# Patient Record
Sex: Male | Born: 1985 | Race: Black or African American | Hispanic: No | Marital: Single | Smoking: Current every day smoker
Health system: Southern US, Community
[De-identification: ages and names within clinical notes are randomized; demographics above are authoritative.]

## PROBLEM LIST (undated history)

## (undated) HISTORY — PX: WISDOM TOOTH EXTRACTION: SHX21

---
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
## 2012-08-20 ENCOUNTER — Emergency Department (HOSPITAL_COMMUNITY)
Admission: EM | Admit: 2012-08-20 | Discharge: 2012-08-20 | Disposition: A | Discharge status: Home / Self Care | Payer: Self-pay | Attending: Emergency Medicine | Admitting: Emergency Medicine

## 2012-08-20 ENCOUNTER — Encounter (HOSPITAL_COMMUNITY): Payer: Self-pay | Admitting: *Deleted

## 2012-08-20 DIAGNOSIS — F17200 Nicotine dependence, unspecified, uncomplicated: Secondary | ICD-10-CM | POA: Insufficient documentation

## 2012-08-20 DIAGNOSIS — B353 Tinea pedis: Secondary | ICD-10-CM | POA: Insufficient documentation

## 2012-08-20 DIAGNOSIS — L03119 Cellulitis of unspecified part of limb: Secondary | ICD-10-CM | POA: Insufficient documentation

## 2012-08-20 DIAGNOSIS — L02619 Cutaneous abscess of unspecified foot: Secondary | ICD-10-CM | POA: Insufficient documentation

## 2012-08-20 DIAGNOSIS — L0390 Cellulitis, unspecified: Secondary | ICD-10-CM

## 2012-08-20 MED ORDER — SULFAMETHOXAZOLE-TRIMETHOPRIM 800-160 MG PO TABS: 1.0000 | ORAL_TABLET | Freq: Two times a day (BID) | ORAL | Status: DC

## 2012-08-20 MED ORDER — OXYCODONE-ACETAMINOPHEN 5-325 MG PO TABS: 1.0000 | ORAL_TABLET | Freq: Four times a day (QID) | ORAL | Status: DC | PRN

## 2012-08-20 MED ORDER — SULFAMETHOXAZOLE-TMP DS 800-160 MG PO TABS
1.0000 | ORAL_TABLET | Freq: Once | ORAL | Status: AC
  Administered 2012-08-20: 1 via ORAL
  Filled 2012-08-20: qty 1

## 2012-08-20 MED ORDER — OXYCODONE-ACETAMINOPHEN 5-325 MG PO TABS
2.0000 | ORAL_TABLET | Freq: Once | ORAL | Status: AC
  Administered 2012-08-20: 2 via ORAL
  Filled 2012-08-20: qty 2

## 2012-08-20 MED ORDER — CEPHALEXIN 250 MG PO CAPS
500.0000 mg | ORAL_CAPSULE | Freq: Once | ORAL | Status: AC
  Administered 2012-08-20: 500 mg via ORAL
  Filled 2012-08-20: qty 2

## 2012-08-20 MED ORDER — CEPHALEXIN 500 MG PO CAPS: 500.0000 mg | ORAL_CAPSULE | Freq: Four times a day (QID) | ORAL | Status: DC

## 2012-08-20 MED ORDER — CLOTRIMAZOLE 1 % EX CREA: TOPICAL_CREAM | CUTANEOUS | Status: DC

## 2012-08-20 NOTE — ED Provider Notes (Signed)
History     CSN: [id number]  Arrival date & time 08/20/12  2035   First MD Initiated Contact with Patient 08/20/12 2047      Chief Complaint  Patient presents with  . Foot Pain    (Consider location/radiation/quality/duration/timing/severity/associated sxs/prior treatment) HPI Comments: Patient presents with complaint of right toe pain X 2 days. Patient states the he began to notice pain, swelling, and redness today. Denies insult or trauma.    The history is provided by the patient. No language interpreter was used.    History reviewed. No pertinent past medical history.  History reviewed. No pertinent past surgical history.  History reviewed. No pertinent family history.  History  Substance Use Topics  . Smoking status: Current Every Day Smoker  . Smokeless tobacco: Not on file  . Alcohol Use: Yes     Comment: occassionally      Review of Systems  Constitutional: Negative for fever and chills.  Musculoskeletal: Positive for arthralgias.  Skin: Positive for color change.    Allergies  Review of patient's allergies indicates no known allergies.  Home Medications  No current outpatient prescriptions on file.  BP 146/89  Pulse 87  Temp 99 F (37.2 C) (Oral)  Resp 16  SpO2 97%  Physical Exam  Nursing note and vitals reviewed. Constitutional: He appears well-developed and well-nourished.  HENT:  Head: Normocephalic and atraumatic.  Mouth/Throat: Oropharynx is clear and moist.  Eyes: Conjunctivae normal and EOM are normal. No scleral icterus.  Neck: Normal range of motion. Neck supple.  Cardiovascular: Normal rate, regular rhythm and normal heart sounds.   Pulmonary/Chest: Effort normal and breath sounds normal.  Abdominal: Soft. Bowel sounds are normal. There is no tenderness.  Neurological: He is alert.  Skin: Skin is warm and dry.       ED Course  Procedures (including critical care time)  Labs Reviewed - No data to display No results  found.   1. Tinea pedis   2. Cellulitis       MDM  Patient presented with signs of secondary skin infection due to poor foot hygiene and tinea pedis. Given first dose on ABX in the ER. Line demarcated around area of cellulitis. Instructed to return in 2 days for recheck. Discharged on bactrim and keflex. Also given topical antifungal for tinea pedis. Return precautions given.         Pixie Casino, PA-C 08/20/12 2147

## 2012-08-20 NOTE — ED Notes (Signed)
Pt state right foot pain. Pain for 2 days along with swelling. Pt states hurts to ambulate and pt states uses foot to help with work by moving heavy boxes and top of foot always hurts.

## 2012-08-20 NOTE — ED Notes (Signed)
Pt stated that he has been having right foot pain since yesterday. It is his third toe. In between toes there is a wound with pus coming out of the wound. Foul smell coming from toes. Swelling in foot. CNS intact.

## 2012-08-21 NOTE — ED Provider Notes (Signed)
Medical screening examination/treatment/procedure(s) were performed by non-physician practitioner and as supervising physician I was immediately available for consultation/collaboration.   Jaquann Guarisco, MD 08/21/12 0028 

## 2014-03-10 ENCOUNTER — Encounter (HOSPITAL_COMMUNITY): Payer: Self-pay | Admitting: Emergency Medicine

## 2014-03-10 ENCOUNTER — Emergency Department (HOSPITAL_COMMUNITY)
Admission: EM | Admit: 2014-03-10 | Discharge: 2014-03-10 | Disposition: A | Discharge status: Home / Self Care | Payer: BC Managed Care – PPO | Attending: Emergency Medicine | Admitting: Emergency Medicine

## 2014-03-10 ENCOUNTER — Emergency Department (HOSPITAL_COMMUNITY): Payer: BC Managed Care – PPO

## 2014-03-10 DIAGNOSIS — Z79899 Other long term (current) drug therapy: Secondary | ICD-10-CM | POA: Insufficient documentation

## 2014-03-10 DIAGNOSIS — M545 Low back pain, unspecified: Secondary | ICD-10-CM

## 2014-03-10 DIAGNOSIS — S39012A Strain of muscle, fascia and tendon of lower back, initial encounter: Secondary | ICD-10-CM

## 2014-03-10 DIAGNOSIS — Y9389 Activity, other specified: Secondary | ICD-10-CM | POA: Insufficient documentation

## 2014-03-10 DIAGNOSIS — Y929 Unspecified place or not applicable: Secondary | ICD-10-CM | POA: Insufficient documentation

## 2014-03-10 DIAGNOSIS — S335XXA Sprain of ligaments of lumbar spine, initial encounter: Secondary | ICD-10-CM | POA: Insufficient documentation

## 2014-03-10 DIAGNOSIS — F17200 Nicotine dependence, unspecified, uncomplicated: Secondary | ICD-10-CM | POA: Insufficient documentation

## 2014-03-10 DIAGNOSIS — Z792 Long term (current) use of antibiotics: Secondary | ICD-10-CM | POA: Insufficient documentation

## 2014-03-10 DIAGNOSIS — X500XXA Overexertion from strenuous movement or load, initial encounter: Secondary | ICD-10-CM | POA: Insufficient documentation

## 2014-03-10 MED ORDER — NAPROXEN 500 MG PO TABS: 500.0000 mg | ORAL_TABLET | Freq: Two times a day (BID) | ORAL | Status: DC | PRN

## 2014-03-10 MED ORDER — MORPHINE SULFATE 4 MG/ML IJ SOLN
4.0000 mg | Freq: Once | INTRAMUSCULAR | Status: AC
  Administered 2014-03-10: 4 mg via INTRAMUSCULAR
  Filled 2014-03-10: qty 1

## 2014-03-10 MED ORDER — IBUPROFEN 800 MG PO TABS
800.0000 mg | ORAL_TABLET | Freq: Once | ORAL | Status: AC
  Administered 2014-03-10: 800 mg via ORAL
  Filled 2014-03-10: qty 1

## 2014-03-10 MED ORDER — OXYCODONE-ACETAMINOPHEN 5-325 MG PO TABS: 1.0000 | ORAL_TABLET | Freq: Four times a day (QID) | ORAL | Status: DC | PRN

## 2014-03-10 MED ORDER — METHOCARBAMOL 500 MG PO TABS: 500.0000 mg | ORAL_TABLET | Freq: Two times a day (BID) | ORAL | Status: DC

## 2014-03-10 MED ORDER — HYDROCODONE-ACETAMINOPHEN 5-325 MG PO TABS
2.0000 | ORAL_TABLET | Freq: Once | ORAL | Status: AC
  Administered 2014-03-10: 2 via ORAL
  Filled 2014-03-10: qty 2

## 2014-03-10 MED ORDER — METHOCARBAMOL 500 MG PO TABS
500.0000 mg | ORAL_TABLET | Freq: Once | ORAL | Status: AC
  Administered 2014-03-10: 500 mg via ORAL
  Filled 2014-03-10: qty 1

## 2014-03-10 MED ORDER — ONDANSETRON 8 MG PO TBDP
8.0000 mg | ORAL_TABLET | Freq: Once | ORAL | Status: AC
  Administered 2014-03-10: 8 mg via ORAL
  Filled 2014-03-10: qty 1

## 2014-03-10 NOTE — ED Provider Notes (Signed)
CSN: [id number]     Arrival date & time 03/10/14  1247 History  This chart was scribed for non-physician practitioner, Dierdre ForthHannah Muthersbaugh, PA-C,working with Raeford RazorStephen Kohut, MD, by Karle PlumberJennifer Tensley, ED Scribe.  This patient was seen in room WTR6/WTR6 and the patient's care was started at 1:01 PM.  Chief Complaint  Patient presents with  . Back Pain    nonradiating back pain since last night   The history is provided by the patient. No language interpreter was used.   HPI Comments:  Jonathan Stokes is a 28 y.o. male who presents to the Emergency Department complaining of severe worsening mid lower back pain that started secondary to lifting a heavy cast iron pizza pan out of the oven last night. He reports past back issues but has since changed jobs without issue. Pt states he has not taken anything for the pain but has tried to rest as much as possible. He denies any back surgeries, diagnosis of bulging discs or any other diagnosis. He denies saddle anesthesia, bowel or bladder incontinence or weakness, numbness, or tingling of the lower extremities. He denies h/o cancer, IV drug use, or taking any anticoagulants. He denies any allergies to any medication. Pt reports eating earlier today. Pt is ambulatory without issue.  History reviewed. No pertinent past medical history. History reviewed. No pertinent past surgical history. Family History  Problem Relation Age of Onset  . Diabetes Other   . Hypertension Other    History  Substance Use Topics  . Smoking status: Current Every Day Smoker    Types: Cigarettes  . Smokeless tobacco: Not on file  . Alcohol Use: Yes     Comment: occassionally    Review of Systems  Genitourinary:       Denies bowel or bladder incontinence.  Musculoskeletal: Positive for back pain.  Neurological: Negative for weakness and numbness.  All other systems reviewed and are negative.   Allergies  Review of patient's allergies indicates no known allergies.  Home  Medications   Prior to Admission medications   Medication Sig Start Date End Date Taking? Authorizing Provider  cephALEXin (KEFLEX) 500 MG capsule Take 1 capsule (500 mg total) by mouth 4 (four) times daily. 08/20/12   Pixie Casinoia Oliveri, PA-C  clotrimazole (LOTRIMIN) 1 % cream Apply to affected area 2 times daily 08/20/12   Pixie Casinoia Oliveri, PA-C  methocarbamol (ROBAXIN) 500 MG tablet Take 1 tablet (500 mg total) by mouth 2 (two) times daily. 03/10/14   Hannah Muthersbaugh, PA-C  naproxen (NAPROSYN) 500 MG tablet Take 1 tablet (500 mg total) by mouth 2 (two) times daily as needed. 03/10/14   Hannah Muthersbaugh, PA-C  oxyCODONE-acetaminophen (PERCOCET) 5-325 MG per tablet Take 1-2 tablets by mouth every 6 (six) hours as needed (Take 1- 2 tablets every 4 - 6 hours as needed for pain.). 03/10/14   Hannah Muthersbaugh, PA-C  oxyCODONE-acetaminophen (PERCOCET/ROXICET) 5-325 MG per tablet Take 1 tablet by mouth every 6 (six) hours as needed for pain (DO NOT FILL WITHOUT FILLING ABX). 08/20/12   Tia Oliveri, PA-C  sulfamethoxazole-trimethoprim (SEPTRA DS) 800-160 MG per tablet Take 1 tablet by mouth every 12 (twelve) hours. 08/20/12   Pixie Casinoia Oliveri, PA-C   Triage Vitals: BP 121/84  Pulse 77  Temp(Src) 97.6 F (36.4 C) (Oral)  Resp 20  Wt 270 lb (122.471 kg)  SpO2 100% Physical Exam  Nursing note and vitals reviewed. Constitutional: He appears well-developed and well-nourished. No distress.  HENT:  Head: Normocephalic and atraumatic.  Mouth/Throat: Oropharynx  is clear and moist. No oropharyngeal exudate.  Eyes: Conjunctivae are normal.  Neck: Normal range of motion. Neck supple.  Full ROM without pain  Cardiovascular: Normal rate, regular rhythm and intact distal pulses.   Pulmonary/Chest: Effort normal and breath sounds normal. No respiratory distress. He has no wheezes.  Abdominal: Soft. He exhibits no distension. There is no tenderness.  Musculoskeletal: He exhibits tenderness. He exhibits no edema.   Decreased range of motion of the L-spine Tenderness to palpation of the spinous processes of the L-spine No tenderness to palpation of the paraspinous muscles of the L-spine  Lymphadenopathy:    He has no cervical adenopathy.  Neurological: He is alert. He has normal reflexes.  Speech is clear and goal oriented, follows commands Normal 5/5 strength in upper and lower extremities bilaterally including dorsiflexion and plantar flexion, strong and equal grip strength Sensation normal to light and sharp touch Moves extremities without ataxia, coordination intact Normal gait Normal balance   Skin: Skin is warm and dry. No rash noted. He is not diaphoretic. No erythema.  Psychiatric: He has a normal mood and affect. His behavior is normal.    ED Course  Procedures (including critical care time) DIAGNOSTIC STUDIES: Oxygen Saturation is 100% on RA, normal by my interpretation.   COORDINATION OF CARE: 1:08 PM- Will order pain medication and X-Ray. Pt verbalizes understanding and agrees to plan.  Medications  ibuprofen (ADVIL,MOTRIN) tablet 800 mg (800 mg Oral Given 03/10/14 1310)  HYDROcodone-acetaminophen (NORCO/VICODIN) 5-325 MG per tablet 2 tablet (2 tablets Oral Given 03/10/14 1310)  methocarbamol (ROBAXIN) tablet 500 mg (500 mg Oral Given 03/10/14 1310)  morphine 4 MG/ML injection 4 mg (4 mg Intramuscular Given 03/10/14 1341)  ondansetron (ZOFRAN-ODT) disintegrating tablet 8 mg (8 mg Oral Given 03/10/14 1341)    Labs Review Labs Reviewed - No data to display  Imaging Review Dg Lumbar Spine Complete  03/10/2014   CLINICAL DATA:  Lifting injury, back pain  EXAM: LUMBAR SPINE - COMPLETE 4+ VIEW  COMPARISON:  None.  FINDINGS: There is no evidence of lumbar spine fracture. Alignment is normal. Intervertebral disc spaces are maintained.  IMPRESSION: Negative.   Electronically Signed   By: Elige KoHetal  Patel   On: 03/10/2014 13:39     EKG Interpretation None      MDM   Final diagnoses:   Midline low back pain without sciatica [724.2]  Strain of lumbar region   Jonathan Stokes presents with low back pain after lifting a heavy pan.  Pt with midline lumbar tenderness, but denies injury.  Will give pain control and image.    2:02 PM L-spine films without evidence of abnormality.  No neurological deficits and normal neuro exam.  Patient can walk but states is painful.  No loss of bowel or bladder control.  No concern for cauda equina.  No fever, night sweats, weight loss, h/o cancer, IVDU.  RICE protocol and pain medicine indicated and discussed with patient.   I have personally reviewed patient's vitals, nursing note and any pertinent labs or imaging.  I performed an undressed physical exam.    At this time, it has been determined that no acute conditions requiring further emergency intervention. The patient/guardian have been advised of the diagnosis and plan. I reviewed all labs and imaging including any potential incidental findings. We have discussed signs and symptoms that warrant return to the ED, such as loss of bowel or bladder control or inability to walk.  Patient/guardian has voiced understanding and agreed to  follow-up with the PCP or specialist in 1 week.    Vital signs are stable at discharge.   BP 121/84  Pulse 77  Temp(Src) 97.6 F (36.4 C) (Oral)  Resp 20  Wt 270 lb (122.471 kg)  SpO2 100%         I personally performed the services described in this documentation, which was scribed in my presence. The recorded information has been reviewed and is accurate.    Dahlia Client Muthersbaugh, PA-C 03/10/14 1408

## 2014-03-10 NOTE — Discharge Instructions (Signed)
1. Medications: robaxin, naproxyn, percocet, usual home medications 2. Treatment: rest, drink plenty of fluids, gentle stretching as discussed, alternate ice and heat 3. Follow Up: Please followup with your primary doctor for discussion of your diagnoses and further evaluation after today's visit; if you do not have a primary care doctor use the resource guide provided to find one;   Back Exercises Back exercises help treat and prevent back injuries. The goal of back exercises is to increase the strength of your abdominal and back muscles and the flexibility of your back. These exercises should be started when you no longer have back pain. Back exercises include:  Pelvic Tilt. Lie on your back with your knees bent. Tilt your pelvis until the lower part of your back is against the floor. Hold this position 5 to 10 sec and repeat 5 to 10 times.  Knee to Chest. Pull first 1 knee up against your chest and hold for 20 to 30 seconds, repeat this with the other knee, and then both knees. This may be done with the other leg straight or bent, whichever feels better.  Sit-Ups or Curl-Ups. Bend your knees 90 degrees. Start with tilting your pelvis, and do a partial, slow sit-up, lifting your trunk only 30 to 45 degrees off the floor. Take at least 2 to 3 seconds for each sit-up. Do not do sit-ups with your knees out straight. If partial sit-ups are difficult, simply do the above but with only tightening your abdominal muscles and holding it as directed.  Hip-Lift. Lie on your back with your knees flexed 90 degrees. Push down with your feet and shoulders as you raise your hips a couple inches off the floor; hold for 10 seconds, repeat 5 to 10 times.  Back arches. Lie on your stomach, propping yourself up on bent elbows. Slowly press on your hands, causing an arch in your low back. Repeat 3 to 5 times. Any initial stiffness and discomfort should lessen with repetition over time.  Shoulder-Lifts. Lie face down  with arms beside your body. Keep hips and torso pressed to floor as you slowly lift your head and shoulders off the floor. Do not overdo your exercises, especially in the beginning. Exercises may cause you some mild back discomfort which lasts for a few minutes; however, if the pain is more severe, or lasts for more than 15 minutes, do not continue exercises until you see your caregiver. Improvement with exercise therapy for back problems is slow.  See your caregivers for assistance with developing a proper back exercise program. Document Released: 10/17/2004 Document Revised: 12/02/2011 Document Reviewed: 07/11/2011 [facility] Patient Information 2015 [place], [place]. This information is not intended to replace advice given to you by your health care provider. Make sure you discuss any questions you have with your health care provider.   Lumbosacral Strain Lumbosacral strain is a strain of any of the parts that make up your lumbosacral vertebrae. Your lumbosacral vertebrae are the bones that make up the lower third of your backbone. Your lumbosacral vertebrae are held together by muscles and tough, fibrous tissue (ligaments).  CAUSES  A sudden blow to your back can cause lumbosacral strain. Also, anything that causes an excessive stretch of the muscles in the low back can cause this strain. This is typically seen when people exert themselves strenuously, fall, lift heavy objects, bend, or crouch repeatedly. RISK FACTORS  Physically demanding work.  Participation in pushing or pulling sports or sports that require a sudden twist of the  back (tennis, golf, baseball).  Weight lifting.  Excessive lower back curvature.  Forward-tilted pelvis.  Weak back or abdominal muscles or both.  Tight hamstrings. SIGNS AND SYMPTOMS  Lumbosacral strain may cause pain in the area of your injury or pain that moves (radiates) down your leg.  DIAGNOSIS Your health care provider can often diagnose lumbosacral  strain through a physical exam. In some cases, you may need tests such as X-ray exams.  TREATMENT  Treatment for your lower back injury depends on many factors that your clinician will have to evaluate. However, most treatment will include the use of anti-inflammatory medicines. HOME CARE INSTRUCTIONS   Avoid hard physical activities (tennis, racquetball, waterskiing) if you are not in proper physical condition for it. This may aggravate or create problems.  If you have a back problem, avoid sports requiring sudden body movements. Swimming and walking are generally safer activities.  Maintain good posture.  Maintain a healthy weight.  For acute conditions, you may put ice on the injured area.  Put ice in a plastic bag.  Place a towel between your skin and the bag.  Leave the ice on for 20 minutes, 2-3 times a day.  When the low back starts healing, stretching and strengthening exercises may be recommended. SEEK MEDICAL CARE IF:  Your back pain is getting worse.  You experience severe back pain not relieved with medicines. SEEK IMMEDIATE MEDICAL CARE IF:   You have numbness, tingling, weakness, or problems with the use of your arms or legs.  There is a change in bowel or bladder control.  You have increasing pain in any area of the body, including your belly (abdomen).  You notice shortness of breath, dizziness, or feel faint.  You feel sick to your stomach (nauseous), are throwing up (vomiting), or become sweaty.  You notice discoloration of your toes or legs, or your feet get very cold. MAKE SURE YOU:   Understand these instructions.  Will watch your condition.  Will get help right away if you are not doing well or get worse. Document Released: 06/19/2005 Document Revised: 09/14/2013 Document Reviewed: 04/28/2013 [facility] Patient Information 2015 [place]. This information is not intended to replace advice given to you by your health care provider. Make sure  you discuss any questions you have with your health care provider.    Emergency Department Resource Guide 1) Find a Doctor and Pay Out of Pocket Although you won't have to find out who is covered by your insurance plan, it is a good idea to ask around and get recommendations. You will then need to call the office and see if the doctor you have chosen will accept you as a new patient and what types of options they offer for patients who are self-pay. Some doctors offer discounts or will set up payment plans for their patients who do not have insurance, but you will need to ask so you aren't surprised when you get to your appointment.  2) Contact Your Local Health Department Not all health departments have doctors that can see patients for sick visits, but many do, so it is worth a call to see if yours does. If you don't know where your local health department is, you can check in your phone book. The CDC also has a tool to help you locate your state's health department, and many state websites also have listings of all of their local health departments.  3) Find a Walk-in Clinic If your illness is not likely  to be very severe or complicated, you may want to try a walk in clinic. These are popping up all over the country in pharmacies, drugstores, and shopping centers. They're usually staffed by nurse practitioners or physician assistants that have been trained to treat common illnesses and complaints. They're usually fairly quick and inexpensive. However, if you have serious medical issues or chronic medical problems, these are probably not your best option.  No Primary Care Doctor: - Call Health Connect at  [phone] - they can help you locate a primary care doctor that  accepts your insurance, provides certain services, etc. - Physician Referral Service- [phone]  Chronic Pain Problems: Organization         Address  Phone   Notes  [facility]  [phone] Patients need  to be referred by their primary care doctor.   Medication Assistance: Organization         Address  Phone   Notes  [facility] Medication [facility] [address] [place] [place], [place] [address] [phone] --Must be a resident of [facility] -- Must have NO insurance coverage whatsoever (no Medicaid/ Medicare, etc.) -- The pt. MUST have a primary care doctor that directs their care regularly and follows them in the community   [facility]  [phone]   [employer]  [phone]    Agencies that provide inexpensive medical care: Organization         Address  Phone   Notes  [facility]  [phone]   [facility]    [phone]   [facility] [address] [place], [place] [address] [phone]   [facility] [address] [place] [place] [phone]   Planned Parenthood    [phone]   [facility]    [phone]   [facility] and [facility]  [address], [place] Phone:  [phone], Fax:  [phone] Hours of Operation:  9 am - 6 pm, M-F.  Also accepts Medicaid/Medicare and self-pay.  [facility]  [address], [place] Phone: [phone], Fax: [phone]. Hours of Operation:  8:30 am - 5:30 pm, M-F.  Also accepts Medicaid and self-pay.  [facility] [place] [address], [place] Phone: [phone]   [facility] [address] Natasha Bence [place], [place] [phone], Ext. [phone] Mondays & Thursdays: 7-9 AM.  First 15 patients are seen on a first come, first serve basis.    Medicaid-accepting [facility] Providers:  Organization         Address  Phone   Notes  [facility] [address], [place] [phone] Also accepts self-pay patients.  [facility] [address] Laurell Josephs [place], [place]  [phone]   [facility] [address], [place] [phone]   [facility] [address] [place] [phone]   Renaye Rakers [address], [place]   [phone] Only accepts [place] patients after they have their name applied to their card.   Self-Pay (no insurance) in [facility]:  Organization         Address  Phone   Notes  Sickle Cell Patients, [facility] [address] [place], [place] [phone]   [facility] [address] [place], [place] [phone]   [facility]  [address], [place] [phone]   [facility]/Dr. Osei-Bonsu  [address] [place] or [address], [place] [phone] Phone number for both [place] and [place] locations is the same.  Urgent Medical and [facility] [address], [place] [phone]   [facility] [address], [place] or [address] [phone]   [facility] [address] [place] [phone], phone; [phone], fax Sees patients 1st and 3rd Saturday of every month.  Must not qualify for public or private insurance (i.e. Medicaid, Medicare, [place] Health Choice, Veterans' Benefits)  Household income should be no more than 200% of the poverty level The clinic cannot treat you if you are pregnant or think you are pregnant  Sexually transmitted diseases are not treated at the clinic.   Dental Care: Organization         Address  Phone  Notes  [facility] Department of [facility] [address] [place], [place] [phone] Accepts children up to age 91 who are enrolled in [place] or [place] Health Choice; pregnant women with a Medicaid card; and children who have applied for Medicaid or [place] Health Choice, but were declined, whose  parents can pay a reduced fee at time of service.  [facility] Department of [facility]  [address], [place] [phone] Accepts children up to age 19 who are enrolled in [place] or [place] Health Choice; pregnant women with a Medicaid card; and children who have applied for Medicaid or [place] Health Choice, but were declined, whose parents can pay a reduced fee at time of service.  [facility]  [address] [place], [place] [phone] Patients are seen by appointment only. Walk-ins are not accepted. [facility] will see patients 18 years of age and older. Monday - Tuesday (8am-5pm) Most Wednesdays (8:30-5pm) $30 per visit, cash only  [facility] Adult [facility]  [address], [facility] [phone] Patients are seen by appointment only. Walk-ins are not accepted. [facility] will see patients 43 years of age and older. One Wednesday Evening (Monthly: Volunteer Based).  $30 per visit, cash only  [employer]  [phone] for adults; Children under age 74, call Graduate Pediatric Dentistry at [phone]. Children aged 71-14, please call [phone] to request a pediatric application.  Dental services are provided in all areas of dental care including fillings, crowns and bridges, complete and partial dentures, implants, gum treatment, root canals, and extractions. Preventive care is also provided. Treatment is provided to both adults and children. Patients are selected via a lottery and there is often a waiting list.   [facility] [address] [place]  [phone] [email or web address]   [facility] [address] [place], [place] [phone], Ext. [phone] Second and Fourth Thursday of each month, opens at 6:30 AM; Clinic ends at 9 AM.  Patients are seen on a first-come first-served basis, and a limited number are seen during each clinic.   [facility]   [address] Ether Griffins [place], [place] [phone]   Eligibility Requirements You must have lived in [place], [place], or [place] counties for at least the last three months.  You cannot be eligible for state or federal sponsored [place]healthcare insurance, including CIG[place]eterans Administration, [place]icaid, or [employer]Medicare.   You generally cannot be eligible for healthcare insurance through your employer.    How to apply: Eligibility screenings are held every Tuesday and Wednesday afternoon from 1:00 pm until 4:00 pm. You do not need an appointment for the interview!  [facility] [address], [place], [place] [phone]   [facility]  [phone]   [facility]  [phone]   [facility]  [phone]    Behavioral Health Resources in the Community: Intensive Outpatient Programs Organization         Address  Phone  Notes  [facility] [address], [place], [place] [phone]   [facility] [address], [place], [place] [phone]   [facility] [address], [place], [place]  [phone]   [facility] [address],  [place], [place] [phone] or [phone]   Substance Abuse Resources Organization         Address  Phone  Notes  [facility]  [phone]   [facility]  [phone]   [facility]  [phone]   [facility]  [phone]   Residential & Outpatient Substance Abuse Program  [phone]   Psychological Services Organization         Address  Phone  Notes  [facility]  [phone]- [phone]   [facility]  [phone]- [phone]   [facility] [address], [place] [phone] or [phone]    Mobile Crisis Teams Organization         Address  Phone  Notes  [facility], Mobile Crisis Care Unit  [phone]     [facility]  [address] [place], [place] [phone]   Doristine LocksSharon DeEsch [address] [place] [phone]    Self-Help/Support Groups Organization         Address  Phone             Notes  [facility] [place] - variety of support groups  [phone] Call for more information  Narcotics Anonymous (NA), [facility] [address], [place]  2 meetings at this location   Statisticianesidential Treatment Programs Organization         Address  Phone  Notes  [facility] [address],    [place]  [phone]   [facility]  [address], [place] [phone], [place], [place] [phone]   [facility] [address], [place] [phone] Admissions: 8am-3pm M-F  [facility] [address]    [place], [place] [phone]   [facility] [address], [place], [place] [phone]   [facility] [address]  [place], [place] [phone]   [facility] - Intensive Outpatient [address] Josephs[address], [place], [place] [phone]   [facility] ([facility] [address]  [place], [place] [phone] or [phone]   [facility] ([facility]) [address] [place], [place] [phone] Accepts Medicaid  [facility] [address]  [place] [phone] Substance Abuse/Addiction Treatment   [facility] Resources Organization         Address  Phone  Notes  [facility]  [phone]) [phone]   Angie FavaJulie Brannon, PhD [phone] Coach  R[address]uanne [address], [address] [place], [place]   [phone] or [phone]   [facility]   [address] [place], [place] [phone]) [phone]   [facility] [address], [place], [place] [phone] Insurance/Medicaid/sponsorship through [facility]  [facility] [address]                                     [place], [place] [phone] Therapy/tele-psych/case  [facility] [address]   [place], [place] [phone]    Dr. Lolly MustacheArfeen  [phone]   [facility]  [employer] [facility] 1) [address],  2) [address], [place] 3)  [address], [place] [phone]  [phone]   [facility] Child Abuse Hotline [phone]) [phone] or [phone] (After Hours)

## 2014-03-10 NOTE — ED Notes (Signed)
Pt reports acute onset of sharp, stabbing low back pain after lifting heavy object last night. Denies leg pain. Pt alert, oriented and appropriate. Pt ambulates without assist

## 2014-03-11 NOTE — ED Provider Notes (Signed)
Medical screening examination/treatment/procedure(s) were performed by non-physician practitioner and as supervising physician I was immediately available for consultation/collaboration.   EKG Interpretation None       Genie Mirabal, MD 03/11/14 0802 

## 2014-08-25 ENCOUNTER — Emergency Department (HOSPITAL_COMMUNITY)
Admission: EM | Admit: 2014-08-25 | Discharge: 2014-08-25 | Disposition: A | Discharge status: Home / Self Care | Payer: BC Managed Care – PPO | Attending: Emergency Medicine | Admitting: Emergency Medicine

## 2014-08-25 ENCOUNTER — Emergency Department (HOSPITAL_COMMUNITY): Payer: BC Managed Care – PPO

## 2014-08-25 ENCOUNTER — Encounter (HOSPITAL_COMMUNITY): Payer: Self-pay | Admitting: Emergency Medicine

## 2014-08-25 DIAGNOSIS — X58XXXA Exposure to other specified factors, initial encounter: Secondary | ICD-10-CM | POA: Insufficient documentation

## 2014-08-25 DIAGNOSIS — Y9289 Other specified places as the place of occurrence of the external cause: Secondary | ICD-10-CM | POA: Insufficient documentation

## 2014-08-25 DIAGNOSIS — R61 Generalized hyperhidrosis: Secondary | ICD-10-CM | POA: Insufficient documentation

## 2014-08-25 DIAGNOSIS — M545 Low back pain, unspecified: Secondary | ICD-10-CM

## 2014-08-25 DIAGNOSIS — Y990 Civilian activity done for income or pay: Secondary | ICD-10-CM | POA: Insufficient documentation

## 2014-08-25 DIAGNOSIS — Y9389 Activity, other specified: Secondary | ICD-10-CM | POA: Diagnosis not present

## 2014-08-25 DIAGNOSIS — Z720 Tobacco use: Secondary | ICD-10-CM | POA: Diagnosis not present

## 2014-08-25 DIAGNOSIS — Z791 Long term (current) use of non-steroidal anti-inflammatories (NSAID): Secondary | ICD-10-CM | POA: Diagnosis not present

## 2014-08-25 DIAGNOSIS — R52 Pain, unspecified: Secondary | ICD-10-CM

## 2014-08-25 DIAGNOSIS — Z792 Long term (current) use of antibiotics: Secondary | ICD-10-CM | POA: Insufficient documentation

## 2014-08-25 DIAGNOSIS — Z79899 Other long term (current) drug therapy: Secondary | ICD-10-CM | POA: Insufficient documentation

## 2014-08-25 DIAGNOSIS — S3992XA Unspecified injury of lower back, initial encounter: Secondary | ICD-10-CM | POA: Diagnosis present

## 2014-08-25 LAB — URINALYSIS, ROUTINE W REFLEX MICROSCOPIC
Bilirubin Urine: NEGATIVE
GLUCOSE, UA: NEGATIVE mg/dL
Hgb urine dipstick: NEGATIVE
KETONES UR: NEGATIVE mg/dL
Nitrite: NEGATIVE
PH: 6 (ref 5.0–8.0)
PROTEIN: NEGATIVE mg/dL
SPECIFIC GRAVITY, URINE: 1.022 (ref 1.005–1.030)
UROBILINOGEN UA: 1 mg/dL (ref 0.0–1.0)

## 2014-08-25 LAB — URINE MICROSCOPIC-ADD ON

## 2014-08-25 MED ORDER — METHOCARBAMOL 500 MG PO TABS: 500.0000 mg | ORAL_TABLET | Freq: Two times a day (BID) | ORAL | Status: DC

## 2014-08-25 MED ORDER — NAPROXEN 500 MG PO TABS: 500.0000 mg | ORAL_TABLET | Freq: Two times a day (BID) | ORAL | Status: DC | PRN

## 2014-08-25 MED ORDER — OXYCODONE-ACETAMINOPHEN 5-325 MG PO TABS: 1.0000 | ORAL_TABLET | Freq: Four times a day (QID) | ORAL | Status: DC | PRN

## 2014-08-25 MED ORDER — IBUPROFEN 800 MG PO TABS
800.0000 mg | ORAL_TABLET | Freq: Once | ORAL | Status: AC
  Administered 2014-08-25: 800 mg via ORAL
  Filled 2014-08-25: qty 1

## 2014-08-25 MED ORDER — OXYCODONE-ACETAMINOPHEN 5-325 MG PO TABS
2.0000 | ORAL_TABLET | Freq: Once | ORAL | Status: AC
  Administered 2014-08-25: 2 via ORAL
  Filled 2014-08-25: qty 2

## 2014-08-25 NOTE — ED Notes (Signed)
Pt was lifting heavy objects at work. Pain in low back started on Tuesday.Denies pain or numbness in legs. Pt is alert, oriented and ambulatory

## 2014-08-25 NOTE — Discharge Instructions (Signed)
Low Back Sprain with Rehab  A sprain is an injury in which a ligament is torn. The ligaments of the lower back are vulnerable to sprains. However, they are strong and require great force to be injured. These ligaments are important for stabilizing the spinal column. Sprains are classified into three categories. Grade 1 sprains cause pain, but the tendon is not lengthened. Grade 2 sprains include a lengthened ligament, due to the ligament being stretched or partially ruptured. With grade 2 sprains there is still function, although the function may be decreased. Grade 3 sprains involve a complete tear of the tendon or muscle, and function is usually impaired. SYMPTOMS   Severe pain in the lower back.  Sometimes, a feeling of a "pop," "snap," or tear, at the time of injury.  Tenderness and sometimes swelling at the injury site.  Uncommonly, bruising (contusion) within 48 hours of injury.  Muscle spasms in the back. CAUSES  Low back sprains occur when a force is placed on the ligaments that is greater than they can handle. Common causes of injury include:  Performing a stressful act while off-balance.  Repetitive stressful activities that involve movement of the lower back.  Direct hit (trauma) to the lower back. RISK INCREASES WITH:  Contact sports (football, wrestling).  Collisions (major skiing accidents).  Sports that require throwing or lifting (baseball, weightlifting).  Sports involving twisting of the spine (gymnastics, diving, tennis, golf).  Poor strength and flexibility.  Inadequate protection.  Previous back injury or surgery (especially fusion). PREVENTION  Wear properly fitted and padded protective equipment.  Warm up and stretch properly before activity.  Allow for adequate recovery between workouts.  Maintain physical fitness:  Strength, flexibility, and endurance.  Cardiovascular fitness.  Maintain a healthy body weight. PROGNOSIS  If treated  properly, low back sprains usually heal with non-surgical treatment. The length of time for healing depends on the severity of the injury.  RELATED COMPLICATIONS   Recurring symptoms, resulting in a chronic problem.  Chronic inflammation and pain in the low back.  Delayed healing or resolution of symptoms, especially if activity is resumed too soon.  Prolonged impairment.  Unstable or arthritic joints of the low back. TREATMENT  Treatment first involves the use of ice and medicine, to reduce pain and inflammation. The use of strengthening and stretching exercises may help reduce pain with activity. These exercises may be performed at home or with a therapist. Severe injuries may require referral to a therapist for further evaluation and treatment, such as ultrasound. Your caregiver may advise that you wear a back brace or corset, to help reduce pain and discomfort. Often, prolonged bed rest results in greater harm then benefit. Corticosteroid injections may be recommended. However, these should be reserved for the most serious cases. It is important to avoid using your back when lifting objects. At night, sleep on your back on a firm mattress, with a pillow placed under your knees. If non-surgical treatment is unsuccessful, surgery may be needed.  MEDICATION   If pain medicine is needed, nonsteroidal anti-inflammatory medicines (aspirin and ibuprofen), or other minor pain relievers (acetaminophen), are often advised.  Do not take pain medicine for 7 days before surgery.  Prescription pain relievers may be given, if your caregiver thinks they are needed. Use only as directed and only as much as you need.  Ointments applied to the skin may be helpful.  Corticosteroid injections may be given by your caregiver. These injections should be reserved for the most serious cases,   because they may only be given a certain number of times. HEAT AND COLD  Cold treatment (icing) should be applied for 10  to 15 minutes every 2 to 3 hours for inflammation and pain, and immediately after activity that aggravates your symptoms. Use ice packs or an ice massage.  Heat treatment may be used before performing stretching and strengthening activities prescribed by your caregiver, physical therapist, or athletic trainer. Use a heat pack or a warm water soak. SEEK MEDICAL CARE IF:   Symptoms get worse or do not improve in 2 to 4 weeks, despite treatment.  You develop numbness or weakness in either leg.  You lose bowel or bladder function.  Any of the following occur after surgery: fever, increased pain, swelling, redness, drainage of fluids, or bleeding in the affected area.  New, unexplained symptoms develop. (Drugs used in treatment may produce side effects.) EXERCISES  RANGE OF MOTION (ROM) AND STRETCHING EXERCISES - Low Back Sprain Most people with lower back pain will find that their symptoms get worse with excessive bending forward (flexion) or arching at the lower back (extension). The exercises that will help resolve your symptoms will focus on the opposite motion.  Your physician, physical therapist or athletic trainer will help you determine which exercises will be most helpful to resolve your lower back pain. Do not complete any exercises without first consulting with your caregiver. Discontinue any exercises which make your symptoms worse, until you speak to your caregiver. If you have pain, numbness or tingling which travels down into your buttocks, leg or foot, the goal of the therapy is for these symptoms to move closer to your back and eventually resolve. Sometimes, these leg symptoms will get better, but your lower back pain may worsen. This is often an indication of progress in your rehabilitation. Be very alert to any changes in your symptoms and the activities in which you participated in the 24 hours prior to the change. Sharing this information with your caregiver will allow him or her to  most efficiently treat your condition. These exercises may help you when beginning to rehabilitate your injury. Your symptoms may resolve with or without further involvement from your physician, physical therapist or athletic trainer. While completing these exercises, remember:   Restoring tissue flexibility helps normal motion to return to the joints. This allows healthier, less painful movement and activity.  An effective stretch should be held for at least 30 seconds.  A stretch should never be painful. You should only feel a gentle lengthening or release in the stretched tissue. FLEXION RANGE OF MOTION AND STRETCHING EXERCISES: STRETCH - Flexion, Single Knee to Chest   Lie on a firm bed or floor with both legs extended in front of you.  Keeping one leg in contact with the floor, bring your opposite knee to your chest. Hold your leg in place by either grabbing behind your thigh or at your knee.  Pull until you feel a gentle stretch in your low back. Hold __________ seconds.  Slowly release your grasp and repeat the exercise with the opposite side. Repeat __________ times. Complete this exercise __________ times per day.  STRETCH - Flexion, Double Knee to Chest  Lie on a firm bed or floor with both legs extended in front of you.  Keeping one leg in contact with the floor, bring your opposite knee to your chest.  Tense your stomach muscles to support your back and then lift your other knee to your chest. Hold your legs   in place by either grabbing behind your thighs or at your knees.  Pull both knees toward your chest until you feel a gentle stretch in your low back. Hold __________ seconds.  Tense your stomach muscles and slowly return one leg at a time to the floor. Repeat __________ times. Complete this exercise __________ times per day.  STRETCH - Low Trunk Rotation  Lie on a firm bed or floor. Keeping your legs in front of you, bend your knees so they are both pointed toward the  ceiling and your feet are flat on the floor.  Extend your arms out to the side. This will stabilize your upper body by keeping your shoulders in contact with the floor.  Gently and slowly drop both knees together to one side until you feel a gentle stretch in your low back. Hold for __________ seconds.  Tense your stomach muscles to support your lower back as you bring your knees back to the starting position. Repeat the exercise to the other side. Repeat __________ times. Complete this exercise __________ times per day  EXTENSION RANGE OF MOTION AND FLEXIBILITY EXERCISES: STRETCH - Extension, Prone on Elbows   Lie on your stomach on the floor, a bed will be too soft. Place your palms about shoulder width apart and at the height of your head.  Place your elbows under your shoulders. If this is too painful, stack pillows under your chest.  Allow your body to relax so that your hips drop lower and make contact more completely with the floor.  Hold this position for __________ seconds.  Slowly return to lying flat on the floor. Repeat __________ times. Complete this exercise __________ times per day.  RANGE OF MOTION - Extension, Prone Press Ups  Lie on your stomach on the floor, a bed will be too soft. Place your palms about shoulder width apart and at the height of your head.  Keeping your back as relaxed as possible, slowly straighten your elbows while keeping your hips on the floor. You may adjust the placement of your hands to maximize your comfort. As you gain motion, your hands will come more underneath your shoulders.  Hold this position __________ seconds.  Slowly return to lying flat on the floor. Repeat __________ times. Complete this exercise __________ times per day.  RANGE OF MOTION- Quadruped, Neutral Spine   Assume a hands and knees position on a firm surface. Keep your hands under your shoulders and your knees under your hips. You may place padding under your knees for  comfort.  Drop your head and point your tailbone toward the ground below you. This will round out your lower back like an angry cat. Hold this position for __________ seconds.  Slowly lift your head and release your tail bone so that your back sags into a large arch, like an old horse.  Hold this position for __________ seconds.  Repeat this until you feel limber in your low back.  Now, find your "sweet spot." This will be the most comfortable position somewhere between the two previous positions. This is your neutral spine. Once you have found this position, tense your stomach muscles to support your low back.  Hold this position for __________ seconds. Repeat __________ times. Complete this exercise __________ times per day.  STRENGTHENING EXERCISES - Low Back Sprain These exercises may help you when beginning to rehabilitate your injury. These exercises should be done near your "sweet spot." This is the neutral, low-back arch, somewhere between fully rounded   and fully arched, that is your least painful position. When performed in this safe range of motion, these exercises can be used for people who have either a flexion or extension based injury. These exercises may resolve your symptoms with or without further involvement from your physician, physical therapist or athletic trainer. While completing these exercises, remember:   Muscles can gain both the endurance and the strength needed for everyday activities through controlled exercises.  Complete these exercises as instructed by your physician, physical therapist or athletic trainer. Increase the resistance and repetitions only as guided.  You may experience muscle soreness or fatigue, but the pain or discomfort you are trying to eliminate should never worsen during these exercises. If this pain does worsen, stop and make certain you are following the directions exactly. If the pain is still present after adjustments, discontinue the  exercise until you can discuss the trouble with your caregiver. STRENGTHENING - Deep Abdominals, Pelvic Tilt   Lie on a firm bed or floor. Keeping your legs in front of you, bend your knees so they are both pointed toward the ceiling and your feet are flat on the floor.  Tense your lower abdominal muscles to press your low back into the floor. This motion will rotate your pelvis so that your tail bone is scooping upwards rather than pointing at your feet or into the floor. With a gentle tension and even breathing, hold this position for __________ seconds. Repeat __________ times. Complete this exercise __________ times per day.  STRENGTHENING - Abdominals, Crunches   Lie on a firm bed or floor. Keeping your legs in front of you, bend your knees so they are both pointed toward the ceiling and your feet are flat on the floor. Cross your arms over your chest.  Slightly tip your chin down without bending your neck.  Tense your abdominals and slowly lift your trunk high enough to just clear your shoulder blades. Lifting higher can put excessive stress on the lower back and does not further strengthen your abdominal muscles.  Control your return to the starting position. Repeat __________ times. Complete this exercise __________ times per day.  STRENGTHENING - Quadruped, Opposite UE/LE Lift   Assume a hands and knees position on a firm surface. Keep your hands under your shoulders and your knees under your hips. You may place padding under your knees for comfort.  Find your neutral spine and gently tense your abdominal muscles so that you can maintain this position. Your shoulders and hips should form a rectangle that is parallel with the floor and is not twisted.  Keeping your trunk steady, lift your right hand no higher than your shoulder and then your left leg no higher than your hip. Make sure you are not holding your breath. Hold this position for __________ seconds.  Continuing to keep  your abdominal muscles tense and your back steady, slowly return to your starting position. Repeat with the opposite arm and leg. Repeat __________ times. Complete this exercise __________ times per day.  STRENGTHENING - Abdominals and Quadriceps, Straight Leg Raise   Lie on a firm bed or floor with both legs extended in front of you.  Keeping one leg in contact with the floor, bend the other knee so that your foot can rest flat on the floor.  Find your neutral spine, and tense your abdominal muscles to maintain your spinal position throughout the exercise.  Slowly lift your straight leg off the floor about 6 inches for a count   of 15, making sure to not hold your breath.  Still keeping your neutral spine, slowly lower your leg all the way to the floor. Repeat this exercise with each leg __________ times. Complete this exercise __________ times per day. POSTURE AND BODY MECHANICS CONSIDERATIONS - Low Back Sprain Keeping correct posture when sitting, standing or completing your activities will reduce the stress put on different body tissues, allowing injured tissues a chance to heal and limiting painful experiences. The following are general guidelines for improved posture. Your physician or physical therapist will provide you with any instructions specific to your needs. While reading these guidelines, remember:  The exercises prescribed by your provider will help you have the flexibility and strength to maintain correct postures.  The correct posture provides the best environment for your joints to work. All of your joints have less wear and tear when properly supported by a spine with good posture. This means you will experience a healthier, less painful body.  Correct posture must be practiced with all of your activities, especially prolonged sitting and standing. Correct posture is as important when doing repetitive low-stress activities (typing) as it is when doing a single heavy-load  activity (lifting). RESTING POSITIONS Consider which positions are most painful for you when choosing a resting position. If you have pain with flexion-based activities (sitting, bending, stooping, squatting), choose a position that allows you to rest in a less flexed posture. You would want to avoid curling into a fetal position on your side. If your pain worsens with extension-based activities (prolonged standing, working overhead), avoid resting in an extended position such as sleeping on your stomach. Most people will find more comfort when they rest with their spine in a more neutral position, neither too rounded nor too arched. Lying on a non-sagging bed on your side with a pillow between your knees, or on your back with a pillow under your knees will often provide some relief. Keep in mind, being in any one position for a prolonged period of time, no matter how correct your posture, can still lead to stiffness. PROPER SITTING POSTURE In order to minimize stress and discomfort on your spine, you must sit with correct posture. Sitting with good posture should be effortless for a healthy body. Returning to good posture is a gradual process. Many people can work toward this most comfortably by using various supports until they have the flexibility and strength to maintain this posture on their own. When sitting with proper posture, your ears will fall over your shoulders and your shoulders will fall over your hips. You should use the back of the chair to support your upper back. Your lower back will be in a neutral position, just slightly arched. You may place a small pillow or folded towel at the base of your lower back for  support.  When working at a desk, create an environment that supports good, upright posture. Without extra support, muscles tire, which leads to excessive strain on joints and other tissues. Keep these recommendations in mind: CHAIR:  A chair should be able to slide under your desk  when your back makes contact with the back of the chair. This allows you to work closely.  The chair's height should allow your eyes to be level with the upper part of your monitor and your hands to be slightly lower than your elbows. BODY POSITION  Your feet should make contact with the floor. If this is not possible, use a foot rest.  Keep your   ears over your shoulders. This will reduce stress on your neck and low back. INCORRECT SITTING POSTURES  If you are feeling tired and unable to assume a healthy sitting posture, do not slouch or slump. This puts excessive strain on your back tissues, causing more damage and pain. Healthier options include:  Using more support, like a lumbar pillow.  Switching tasks to something that requires you to be upright or walking.  Talking a brief walk.  Lying down to rest in a neutral-spine position. PROLONGED STANDING WHILE SLIGHTLY LEANING FORWARD  When completing a task that requires you to lean forward while standing in one place for a long time, place either foot up on a stationary 2-4 inch high object to help maintain the best posture. When both feet are on the ground, the lower back tends to lose its slight inward curve. If this curve flattens (or becomes too large), then the back and your other joints will experience too much stress, tire more quickly, and can cause pain. CORRECT STANDING POSTURES Proper standing posture should be assumed with all daily activities, even if they only take a few moments, like when brushing your teeth. As in sitting, your ears should fall over your shoulders and your shoulders should fall over your hips. You should keep a slight tension in your abdominal muscles to brace your spine. Your tailbone should point down to the ground, not behind your body, resulting in an over-extended swayback posture.  INCORRECT STANDING POSTURES  Common incorrect standing postures include a forward head, locked knees and/or an excessive  swayback. WALKING Walk with an upright posture. Your ears, shoulders and hips should all line-up. PROLONGED ACTIVITY IN A FLEXED POSITION When completing a task that requires you to bend forward at your waist or lean over a low surface, try to find a way to stabilize 3 out of 4 of your limbs. You can place a hand or elbow on your thigh or rest a knee on the surface you are reaching across. This will provide you more stability, so that your muscles do not tire as quickly. By keeping your knees relaxed, or slightly bent, you will also reduce stress across your lower back. CORRECT LIFTING TECHNIQUES DO :  Assume a wide stance. This will provide you more stability and the opportunity to get as close as possible to the object which you are lifting.  Tense your abdominals to brace your spine. Bend at the knees and hips. Keeping your back locked in a neutral-spine position, lift using your leg muscles. Lift with your legs, keeping your back straight.  Test the weight of unknown objects before attempting to lift them.  Try to keep your elbows locked down at your sides in order get the best strength from your shoulders when carrying an object.  Always ask for help when lifting heavy or awkward objects. INCORRECT LIFTING TECHNIQUES DO NOT:   Lock your knees when lifting, even if it is a small object.  Bend and twist. Pivot at your feet or move your feet when needing to change directions.  Assume that you can safely pick up even a paperclip without proper posture. Document Released: 09/09/2005 Document Revised: 12/02/2011 Document Reviewed: 12/22/2008 [employer] Patient Information 2015 [employer]. This information is not intended to replace advice given to you by your health care provider. Make sure you discuss any questions you have with your health care provider.  

## 2014-08-25 NOTE — ED Provider Notes (Signed)
CSN: [id number]     Arrival date & time 08/25/14  1553 History  This chart was scribed for non-physician practitioner, Langston MaskerKaren Braylinn Gulden, PA-C, working with Purvis SheffieldForrest Harrison, MD by Charline BillsEssence Howell, ED Scribe. This patient was seen in room WTR5/WTR5 and the patient's care was started at 4:32 PM.  Chief Complaint  Patient presents with  . Back Pain    pt bent over to pick up heavy object, pain in low back x 2 days   The history is provided by the patient. No language interpreter was used.   HPI Comments: Jonathan Stokes is a 28 y.o. male who presents to the Emergency Department complaining of gradually worsening lower back pain onset 2 days ago. Pt reports that he initially injured his back 2 days ago while lifting 2 6 packs of beer. He states that he re-injured his back yesterday while lifting cases from the floor to a shelf. Pain is exacerbated with movement. No medications tried PTA.   History reviewed. No pertinent past medical history. Past Surgical History  Procedure Laterality Date  . Wisdom tooth extraction     Family History  Problem Relation Age of Onset  . Diabetes Other   . Hypertension Other   . Hypertension Father    History  Substance Use Topics  . Smoking status: Current Every Day Smoker    Types: Cigarettes  . Smokeless tobacco: Not on file  . Alcohol Use: Yes     Comment: occassionally    Review of Systems  Musculoskeletal: Positive for back pain.  All other systems reviewed and are negative.  Allergies  Review of patient's allergies indicates no known allergies.  Home Medications   Prior to Admission medications   Medication Sig Start Date End Date Taking? Authorizing Provider  cephALEXin (KEFLEX) 500 MG capsule Take 1 capsule (500 mg total) by mouth 4 (four) times daily. 08/20/12   Pixie Casinoia Oliveri, PA-C  clotrimazole (LOTRIMIN) 1 % cream Apply to affected area 2 times daily 08/20/12   Pixie Casinoia Oliveri, PA-C  methocarbamol (ROBAXIN) 500 MG tablet Take 1 tablet (500 mg  total) by mouth 2 (two) times daily. 03/10/14   Hannah Muthersbaugh, PA-C  naproxen (NAPROSYN) 500 MG tablet Take 1 tablet (500 mg total) by mouth 2 (two) times daily as needed. 03/10/14   Hannah Muthersbaugh, PA-C  oxyCODONE-acetaminophen (PERCOCET) 5-325 MG per tablet Take 1-2 tablets by mouth every 6 (six) hours as needed (Take 1- 2 tablets every 4 - 6 hours as needed for pain.). 03/10/14   Hannah Muthersbaugh, PA-C  oxyCODONE-acetaminophen (PERCOCET/ROXICET) 5-325 MG per tablet Take 1 tablet by mouth every 6 (six) hours as needed for pain (DO NOT FILL WITHOUT FILLING ABX). 08/20/12   Tia Oliveri, PA-C  sulfamethoxazole-trimethoprim (SEPTRA DS) 800-160 MG per tablet Take 1 tablet by mouth every 12 (twelve) hours. 08/20/12   Pixie Casinoia Oliveri, PA-C   Triage Vitals: BP 156/97 mmHg  Pulse 96  Temp(Src) 98.6 F (37 C) (Oral)  Resp 20  SpO2 100% Physical Exam  Constitutional: He is oriented to person, place, and time. He appears well-developed and well-nourished. No distress.  HENT:  Head: Normocephalic and atraumatic.  Eyes: Conjunctivae and EOM are normal.  Neck: Neck supple. No tracheal deviation present.  Cardiovascular: Normal rate.   Pulmonary/Chest: Effort normal. No respiratory distress.  Musculoskeletal: Normal range of motion.  Tender to lower lumbar perivertebrals bilaterally.   Neurological: He is alert and oriented to person, place, and time.  Skin: Skin is warm. He is diaphoretic.  Psychiatric: He has a normal mood and affect. His behavior is normal.  Nursing note and vitals reviewed.  ED Course  Procedures (including critical care time) DIAGNOSTIC STUDIES: Oxygen Saturation is 100% on RA, normal by my interpretation.    COORDINATION OF CARE: 4:36 PM-Discussed treatment plan which includes UA, XRs, Percocet and ibuprofen with pt at bedside and pt agreed to plan.   Labs Review Labs Reviewed  URINALYSIS, ROUTINE W REFLEX MICROSCOPIC   Imaging Review Dg Lumbar Spine  Complete  08/25/2014   CLINICAL DATA:  Injured low back, low back pain  EXAM: LUMBAR SPINE - COMPLETE 4+ VIEW  COMPARISON:  None.  FINDINGS: There is no evidence of lumbar spine fracture. Alignment is normal. Intervertebral disc spaces are maintained.  IMPRESSION: Negative.   Electronically Signed   By: Elige KoHetal  Patel   On: 08/25/2014 17:40    EKG Interpretation None      MDM   Final diagnoses:  Pain  Bilateral low back pain without sciatica    Pt reports he feels better after pain medication.    I personally performed the services described in this documentation, which was scribed in my presence. The recorded information has been reviewed and is accurate. Pt advised to see Dr. Roda ShuttersXu Rx for percocet Rx for naprosyn and robaxin    Elson AreasLeslie K Tayshon Winker, PA-C 08/25/14 1909  Purvis SheffieldForrest Harrison, MD 08/26/14 [id number]

## 2017-11-06 DIAGNOSIS — Z0000 Encounter for general adult medical examination without abnormal findings: Secondary | ICD-10-CM | POA: Diagnosis not present

## 2017-11-06 DIAGNOSIS — Z13220 Encounter for screening for lipoid disorders: Secondary | ICD-10-CM | POA: Diagnosis not present

## 2018-04-25 DIAGNOSIS — S39012A Strain of muscle, fascia and tendon of lower back, initial encounter: Secondary | ICD-10-CM | POA: Diagnosis not present

## 2018-08-17 DIAGNOSIS — F17210 Nicotine dependence, cigarettes, uncomplicated: Secondary | ICD-10-CM | POA: Diagnosis not present

## 2018-08-17 DIAGNOSIS — E6609 Other obesity due to excess calories: Secondary | ICD-10-CM | POA: Diagnosis not present

## 2018-08-17 DIAGNOSIS — I10 Essential (primary) hypertension: Secondary | ICD-10-CM | POA: Diagnosis not present

## 2018-08-17 DIAGNOSIS — Z6839 Body mass index (BMI) 39.0-39.9, adult: Secondary | ICD-10-CM | POA: Diagnosis not present

## 2019-02-23 DIAGNOSIS — H1031 Unspecified acute conjunctivitis, right eye: Secondary | ICD-10-CM | POA: Diagnosis not present

## 2019-03-06 DIAGNOSIS — Z20828 Contact with and (suspected) exposure to other viral communicable diseases: Secondary | ICD-10-CM | POA: Diagnosis not present

## 2019-04-26 ENCOUNTER — Other Ambulatory Visit: Payer: Self-pay

## 2019-04-26 DIAGNOSIS — Z20822 Contact with and (suspected) exposure to covid-19: Secondary | ICD-10-CM

## 2019-04-28 LAB — NOVEL CORONAVIRUS, NAA: SARS-CoV-2, NAA: DETECTED — AB

## 2019-05-06 ENCOUNTER — Other Ambulatory Visit: Payer: Self-pay

## 2019-05-06 DIAGNOSIS — Z20822 Contact with and (suspected) exposure to covid-19: Secondary | ICD-10-CM

## 2019-05-07 LAB — NOVEL CORONAVIRUS, NAA: SARS-CoV-2, NAA: NOT DETECTED

## 2019-05-11 ENCOUNTER — Other Ambulatory Visit: Payer: Self-pay

## 2019-05-11 DIAGNOSIS — Z20822 Contact with and (suspected) exposure to covid-19: Secondary | ICD-10-CM

## 2019-05-12 LAB — NOVEL CORONAVIRUS, NAA: SARS-CoV-2, NAA: NOT DETECTED

## 2019-05-13 ENCOUNTER — Other Ambulatory Visit: Payer: Self-pay

## 2019-05-13 DIAGNOSIS — Z20822 Contact with and (suspected) exposure to covid-19: Secondary | ICD-10-CM

## 2019-05-14 LAB — NOVEL CORONAVIRUS, NAA: SARS-CoV-2, NAA: NOT DETECTED

## 2021-08-13 ENCOUNTER — Encounter (HOSPITAL_COMMUNITY): Payer: Self-pay

## 2021-08-13 ENCOUNTER — Emergency Department (HOSPITAL_COMMUNITY): Payer: No Typology Code available for payment source

## 2021-08-13 ENCOUNTER — Emergency Department (HOSPITAL_COMMUNITY)
Admission: EM | Admit: 2021-08-13 | Discharge: 2021-08-13 | Disposition: A | Discharge status: Home / Self Care | Payer: No Typology Code available for payment source | Attending: Emergency Medicine | Admitting: Emergency Medicine

## 2021-08-13 ENCOUNTER — Other Ambulatory Visit: Payer: Self-pay

## 2021-08-13 DIAGNOSIS — I10 Essential (primary) hypertension: Secondary | ICD-10-CM

## 2021-08-13 DIAGNOSIS — F17210 Nicotine dependence, cigarettes, uncomplicated: Secondary | ICD-10-CM | POA: Diagnosis not present

## 2021-08-13 DIAGNOSIS — R519 Headache, unspecified: Secondary | ICD-10-CM | POA: Insufficient documentation

## 2021-08-13 DIAGNOSIS — S99911A Unspecified injury of right ankle, initial encounter: Secondary | ICD-10-CM

## 2021-08-13 DIAGNOSIS — Y92410 Unspecified street and highway as the place of occurrence of the external cause: Secondary | ICD-10-CM | POA: Insufficient documentation

## 2021-08-13 DIAGNOSIS — Z79899 Other long term (current) drug therapy: Secondary | ICD-10-CM | POA: Insufficient documentation

## 2021-08-13 MED ORDER — HYDROCHLOROTHIAZIDE 25 MG PO TABS: 25.0000 mg | ORAL_TABLET | Freq: Every day | ORAL | 1 refills | Status: DC

## 2021-08-13 MED ORDER — HYDROCHLOROTHIAZIDE 25 MG PO TABS
25.0000 mg | ORAL_TABLET | ORAL | Status: AC
  Administered 2021-08-13: 25 mg via ORAL
  Filled 2021-08-13: qty 1

## 2021-08-13 MED ORDER — NAPROXEN 500 MG PO TABS: 500.0000 mg | ORAL_TABLET | Freq: Two times a day (BID) | ORAL | 0 refills | Status: DC

## 2021-08-13 MED ORDER — METHOCARBAMOL 500 MG PO TABS: 500.0000 mg | ORAL_TABLET | Freq: Two times a day (BID) | ORAL | 0 refills | Status: AC

## 2021-08-13 MED ORDER — HYDROCHLOROTHIAZIDE 12.5 MG PO CAPS
25.0000 mg | ORAL_CAPSULE | Freq: Once | ORAL | Status: DC
  Filled 2021-08-13: qty 2

## 2021-08-13 MED ORDER — OXYCODONE HCL 5 MG PO TABS
10.0000 mg | ORAL_TABLET | Freq: Once | ORAL | Status: AC
  Administered 2021-08-13: 10 mg via ORAL
  Filled 2021-08-13: qty 2

## 2021-08-13 MED ORDER — OXYCODONE HCL 5 MG PO TABS
5.0000 mg | ORAL_TABLET | Freq: Four times a day (QID) | ORAL | 0 refills | Status: AC | PRN
Start: 2021-08-13 — End: ?
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
## 2021-08-13 NOTE — ED Notes (Signed)
Patient transported to X-ray 
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
## 2021-08-13 NOTE — Discharge Instructions (Signed)
1. Medications: naprosyn and robaxin for muscle aches and pain, HCTZ for blood pressure management, usual home medications 2. Treatment: rest, ice, elevate and use brace, drink plenty of fluids, gentle stretching 3. Follow Up: Please followup with orthopedics as directed or your PCP in 1 week if no improvement for discussion of your diagnoses and further evaluation after today's visit; if you do not have a primary care doctor use the resource guide provided to find one; Please return to the ER for worsening symptoms or other concerns

## 2021-11-23 ENCOUNTER — Emergency Department (HOSPITAL_COMMUNITY)
Admission: EM | Admit: 2021-11-23 | Discharge: 2021-11-23 | Disposition: A | Discharge status: Home / Self Care | Payer: No Typology Code available for payment source | Attending: Emergency Medicine | Admitting: Emergency Medicine

## 2021-11-23 ENCOUNTER — Emergency Department (HOSPITAL_COMMUNITY): Payer: No Typology Code available for payment source

## 2021-11-23 ENCOUNTER — Encounter (HOSPITAL_COMMUNITY): Payer: Self-pay

## 2021-11-23 DIAGNOSIS — G8929 Other chronic pain: Secondary | ICD-10-CM | POA: Insufficient documentation

## 2021-11-23 DIAGNOSIS — R519 Headache, unspecified: Secondary | ICD-10-CM | POA: Diagnosis not present

## 2021-11-23 DIAGNOSIS — S0990XD Unspecified injury of head, subsequent encounter: Secondary | ICD-10-CM | POA: Diagnosis present

## 2021-11-23 DIAGNOSIS — M25571 Pain in right ankle and joints of right foot: Secondary | ICD-10-CM | POA: Insufficient documentation

## 2021-11-23 DIAGNOSIS — Y92410 Unspecified street and highway as the place of occurrence of the external cause: Secondary | ICD-10-CM | POA: Diagnosis not present

## 2021-11-23 DIAGNOSIS — Z79899 Other long term (current) drug therapy: Secondary | ICD-10-CM | POA: Diagnosis not present
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
## 2021-11-23 NOTE — Discharge Instructions (Signed)
Please use Tylenol or ibuprofen for pain.  You may use 600 mg ibuprofen every 6 hours or 1000 mg of Tylenol every 6 hours.  You may choose to alternate between the 2.  This would be most effective.  Not to exceed 4 g of Tylenol within 24 hours.  Not to exceed 3200 mg ibuprofen 24 hours. ? ?You can apply ice to the affected extremity, and keep it elevated up with pain.  Recommend that you follow-up with orthopedics if you continue have persistent pain to discuss further management, possible physical therapy. ? ? ?

## 2022-01-20 ENCOUNTER — Encounter (HOSPITAL_COMMUNITY): Payer: Self-pay | Admitting: Oncology

## 2022-01-20 ENCOUNTER — Emergency Department (HOSPITAL_COMMUNITY)
Admission: EM | Admit: 2022-01-20 | Discharge: 2022-01-20 | Disposition: A | Discharge status: Home / Self Care | Payer: Self-pay | Attending: Emergency Medicine | Admitting: Emergency Medicine

## 2022-01-20 ENCOUNTER — Other Ambulatory Visit: Payer: Self-pay

## 2022-01-20 DIAGNOSIS — I10 Essential (primary) hypertension: Secondary | ICD-10-CM | POA: Insufficient documentation

## 2022-01-20 DIAGNOSIS — R7401 Elevation of levels of liver transaminase levels: Secondary | ICD-10-CM | POA: Insufficient documentation

## 2022-01-20 LAB — CBC WITH DIFFERENTIAL/PLATELET
Abs Immature Granulocytes: 0.03 10*3/uL (ref 0.00–0.07)
Basophils Absolute: 0 10*3/uL (ref 0.0–0.1)
Basophils Relative: 0 %
Eosinophils Absolute: 0.1 10*3/uL (ref 0.0–0.5)
Eosinophils Relative: 1 %
HCT: 47.4 % (ref 39.0–52.0)
Hemoglobin: 15.8 g/dL (ref 13.0–17.0)
Immature Granulocytes: 0 %
Lymphocytes Relative: 23 %
Lymphs Abs: 1.6 10*3/uL (ref 0.7–4.0)
MCH: 29.7 pg (ref 26.0–34.0)
MCHC: 33.3 g/dL (ref 30.0–36.0)
MCV: 89.1 fL (ref 80.0–100.0)
Monocytes Absolute: 1.2 10*3/uL — ABNORMAL HIGH (ref 0.1–1.0)
Monocytes Relative: 17 %
Neutro Abs: 4 10*3/uL (ref 1.7–7.7)
Neutrophils Relative %: 59 %
Platelets: 307 10*3/uL (ref 150–400)
RBC: 5.32 MIL/uL (ref 4.22–5.81)
RDW: 13.1 % (ref 11.5–15.5)
WBC: 6.8 10*3/uL (ref 4.0–10.5)
nRBC: 0 % (ref 0.0–0.2)

## 2022-01-20 LAB — HEPATIC FUNCTION PANEL
ALT: 39 U/L (ref 0–44)
AST: 46 U/L — ABNORMAL HIGH (ref 15–41)
Albumin: 3.8 g/dL (ref 3.5–5.0)
Alkaline Phosphatase: 63 U/L (ref 38–126)
Bilirubin, Direct: 0.3 mg/dL — ABNORMAL HIGH (ref 0.0–0.2)
Indirect Bilirubin: 1 mg/dL — ABNORMAL HIGH (ref 0.3–0.9)
Total Bilirubin: 1.3 mg/dL — ABNORMAL HIGH (ref 0.3–1.2)
Total Protein: 7.9 g/dL (ref 6.5–8.1)

## 2022-01-20 LAB — BASIC METABOLIC PANEL
Anion gap: 8 (ref 5–15)
BUN: 8 mg/dL (ref 6–20)
CO2: 24 mmol/L (ref 22–32)
Calcium: 8.9 mg/dL (ref 8.9–10.3)
Chloride: 105 mmol/L (ref 98–111)
Creatinine, Ser: 1.01 mg/dL (ref 0.61–1.24)
GFR, Estimated: >60 mL/min (ref >60)
Glucose, Bld: 119 mg/dL — ABNORMAL HIGH (ref 70–99)
Potassium: 3.4 mmol/L — ABNORMAL LOW (ref 3.5–5.1)
Sodium: 137 mmol/L (ref 135–145)

## 2022-01-20 MED ORDER — HYDROCHLOROTHIAZIDE 12.5 MG PO TABS
12.5000 mg | ORAL_TABLET | Freq: Once | ORAL | Status: AC
  Administered 2022-01-20: 12.5 mg via ORAL
  Filled 2022-01-20: qty 1

## 2022-01-20 MED ORDER — HYDROCHLOROTHIAZIDE 25 MG PO TABS: 25.0000 mg | ORAL_TABLET | Freq: Every day | ORAL | 2 refills | Status: AC

## 2022-01-20 NOTE — ED Triage Notes (Signed)
Pt has uncontrolled HTN is not on or has never been on antihypertensives d/t financial concerns as well as lack of insurance. Pt had an issue of blurred vision yesterday. Denies associated sx.   ?

## 2022-01-20 NOTE — ED Provider Notes (Signed)
?[facility]-EMERGENCY DEPT ?Provider Note ? ? ?CSN: [id number] ?Arrival date & time: 01/20/22  1049 ? ?  ? ?History ? ?Chief Complaint  ?Patient presents with  ? Hypertension  ? ? ?Tye MarylandLance Baglio is a 36 y.o. male presenting due to elevated blood pressure.  He reports that for many years he is known that he has had elevated blood pressure.  At one point he was on HCTZ however he stopped taking it.  Says that he thinks his blood pressure is higher now because of some increases in his stress.  Denies any chest pain, back pain or headaches but says yesterday he had 1 "couple seconds" of blurred vision.  York SpanielSaid it was when he turns his head to the side very quickly.  No other known medical problems.  Does not have a primary care provider.  He and his spouse state that they take his blood pressure regularly and it is almost always in the 150s over 110s. ? ? ? ?Hypertension ? ? ?  ? ?Home Medications ?Prior to Admission medications   ?Medication Sig Start Date End Date Taking? Authorizing Provider  ?hydrochlorothiazide (HYDRODIURIL) 25 MG tablet Take 1 tablet (25 mg total) by mouth daily. 08/13/21   Muthersbaugh, Dahlia ClientHannah, PA-C  ?methocarbamol (ROBAXIN) 500 MG tablet Take 1 tablet (500 mg total) by mouth 2 (two) times daily. 08/13/21   Muthersbaugh, Dahlia ClientHannah, PA-C  ?naproxen (NAPROSYN) 500 MG tablet Take 1 tablet (500 mg total) by mouth 2 (two) times daily with a meal. 08/13/21   Muthersbaugh, Dahlia ClientHannah, PA-C  ?oxyCODONE (ROXICODONE) 5 MG immediate release tablet Take 1 tablet (5 mg total) by mouth every 6 (six) hours as needed for severe pain. 08/13/21   Muthersbaugh, Dahlia ClientHannah, PA-C  ?   ? ?Allergies    ?Fish allergy   ? ?Review of Systems   ?Review of Systems ? ?Physical Exam ?Updated Vital Signs ?BP (!) 162/109 (BP Location: Left Arm)   Pulse 98   Temp 98.2 ?F (36.8 ?C) (Oral)   Resp 20   Ht 6\' 2"  (1.88 m)   Wt 117.9 kg   SpO2 96%   BMI 33.38 kg/m?  ?Physical Exam ?Vitals and nursing note reviewed.   ?Constitutional:   ?   General: He is not in acute distress. ?   Appearance: Normal appearance.  ?HENT:  ?   Head: Normocephalic and atraumatic.  ?Eyes:  ?   General: No scleral icterus. ?   Conjunctiva/sclera: Conjunctivae normal.  ?   Pupils: Pupils are equal, round, and reactive to light.  ?Cardiovascular:  ?   Rate and Rhythm: Normal rate and regular rhythm.  ?Pulmonary:  ?   Effort: Pulmonary effort is normal. No respiratory distress.  ?   Breath sounds: No wheezing or rales.  ?Skin: ?   Findings: No rash.  ?Neurological:  ?   General: No focal deficit present.  ?   Mental Status: He is alert.  ?   Cranial Nerves: No cranial nerve deficit.  ?   Comments: Patient moving all extremities with 5 out of 5 strength in bilateral upper and lower.  No problems with cerebellar test or cranial nerve testing.  ?Psychiatric:     ?   Mood and Affect: Mood normal.  ? ? ?ED Results / Procedures / Treatments   ?Labs ?(all labs ordered are listed, but only abnormal results are displayed) ?Labs Reviewed  ?CBC WITH DIFFERENTIAL/PLATELET - Abnormal; Notable for the following components:  ?    Result Value  ?  Monocytes Absolute 1.2 (*)   ? All other components within normal limits  ?BASIC METABOLIC PANEL - Abnormal; Notable for the following components:  ? Potassium 3.4 (*)   ? Glucose, Bld 119 (*)   ? All other components within normal limits  ?HEPATIC FUNCTION PANEL - Abnormal; Notable for the following components:  ? AST 46 (*)   ? Total Bilirubin 1.3 (*)   ? Bilirubin, Direct 0.3 (*)   ? Indirect Bilirubin 1.0 (*)   ? All other components within normal limits  ? ? ?EKG ?None ? ?Radiology ?No results found. ? ?Procedures ?Procedures  ? ?Medications Ordered in ED ?Medications - No data to display ? ?ED Course/ Medical Decision Making/ A&P ?  ?                        ?Medical Decision Making ?Amount and/or Complexity of Data Reviewed ?Labs: ordered. ? ?Risk ?Prescription drug management. ? ? ?36 year old male presenting with a  concern for hypertension.  He used to be on medications for this however has stopped taking them due to financial concern. ?  ?Past Medical History / Co-morbidities / Social History: ?Obesity ?  ?Additional history: ?Additional history obtained from chart review.  Patient has had 2 visits to the emergency department recently and on both times his blood pressure was elevated. ?  ?Physical Exam: ?Physical exam performed. The pertinent findings include: No pertinent findings, neurologic exam negative ? ?Lab Tests: ?I ordered, and personally interpreted labs.  The pertinent results include: Normal kidney function.  Hepatic function added due to patient reporting that he drinks 4-5 drinks on the weekend and other days having 1.  AST mildly elevated, T. bili mildly elevated.  Low concern liver dysfunction ?  ?Imaging Studies: ?I considered CT scan however patient has no signs of hemorrhage or ischemia on his physical exam ?  ?Cardiac Monitoring:  ?The patient was maintained on a cardiac monitor.  This showed an underlying rhythm of normal sinus ?  ?Medications: ?I ordered medication including HCTZ. Reevaluation of the patient after these medicines showed that the patient stayed the same. I have reviewed the patients home medicines and have made adjustments as needed. ?  ?Disposition: ?After consideration of the diagnostic results and the patients response to treatment, I feel that the patient is stable for discharge home.  He will be represcribed HCTZ and given refills until he is able to get in with primary care.  He has been given information for community health and wellness and we discussed that when his health insurance kicks in later in the month he may be able to see a different office.  He and his wife are agreeable to this plan.  Given information about hypertensive emergencies and a sheet to keep track of his blood pressure until his PCP appointment ?  ?Final Clinical Impression(s) / ED Diagnoses ?Final  diagnoses:  ?Hypertension, unspecified type  ? ? ?Rx / DC Orders ?ED Discharge Orders   ? ?      Ordered  ?  hydrochlorothiazide (HYDRODIURIL) 25 MG tablet  Daily       ? 01/20/22 1317  ? ?  ?  ? ?  ? ?Results and diagnoses were explained to the patient. Return precautions discussed in full. Patient had no additional questions and expressed complete understanding. ? ? ?This chart was dictated using voice recognition software.  Despite best efforts to proofread,  errors can occur which can change the documentation  meaning.  ? ?  ?Erilyn Pearman A, PA-C ?01/20/22 1320 ? ?  ?Sloan Leiter, DO ?01/20/22 1858 ? ?

## 2022-01-20 NOTE — ED Notes (Signed)
Bedside urinal ?

## 2022-01-20 NOTE — Discharge Instructions (Signed)
Information about hypertension is attached to these discharge papers.  I have also attached the form we discussed.  Take your blood pressure twice daily. ? ?Follow-up with primary care provider as soon as you are able.  Your medication is at the pharmacy ?

## 2022-07-04 ENCOUNTER — Other Ambulatory Visit: Payer: Self-pay | Admitting: Nurse Practitioner

## 2022-07-04 ENCOUNTER — Ambulatory Visit
Admission: RE | Admit: 2022-07-04 | Discharge: 2022-07-04 | Disposition: A | Discharge status: Home / Self Care | Payer: No Typology Code available for payment source | Source: Ambulatory Visit | Attending: Nurse Practitioner | Admitting: Nurse Practitioner

## 2022-07-04 DIAGNOSIS — T148XXA Other injury of unspecified body region, initial encounter: Secondary | ICD-10-CM

## 2022-07-04 DIAGNOSIS — R609 Edema, unspecified: Secondary | ICD-10-CM

## 2023-04-26 ENCOUNTER — Other Ambulatory Visit: Payer: Self-pay

## 2023-04-26 ENCOUNTER — Encounter (HOSPITAL_COMMUNITY): Payer: Self-pay

## 2023-04-26 ENCOUNTER — Emergency Department (HOSPITAL_COMMUNITY)
Admission: EM | Admit: 2023-04-26 | Discharge: 2023-04-26 | Disposition: A | Discharge status: Home / Self Care | Payer: 59 | Attending: Emergency Medicine | Admitting: Emergency Medicine

## 2023-04-26 ENCOUNTER — Emergency Department (HOSPITAL_COMMUNITY): Payer: 59

## 2023-04-26 DIAGNOSIS — Y92000 Kitchen of unspecified non-institutional (private) residence as  the place of occurrence of the external cause: Secondary | ICD-10-CM | POA: Diagnosis not present

## 2023-04-26 DIAGNOSIS — S93402A Sprain of unspecified ligament of left ankle, initial encounter: Secondary | ICD-10-CM | POA: Diagnosis not present

## 2023-04-26 DIAGNOSIS — Y9301 Activity, walking, marching and hiking: Secondary | ICD-10-CM | POA: Insufficient documentation

## 2023-04-26 DIAGNOSIS — S99912A Unspecified injury of left ankle, initial encounter: Secondary | ICD-10-CM | POA: Diagnosis present

## 2023-04-26 DIAGNOSIS — X501XXA Overexertion from prolonged static or awkward postures, initial encounter: Secondary | ICD-10-CM | POA: Insufficient documentation

## 2023-04-26 MED ORDER — IBUPROFEN 800 MG PO TABS: 800.0000 mg | ORAL_TABLET | Freq: Once | ORAL | Status: DC

## 2023-04-26 MED ORDER — NAPROXEN 500 MG PO TABS
500.0000 mg | ORAL_TABLET | Freq: Once | ORAL | Status: AC
  Administered 2023-04-26: 500 mg via ORAL
  Filled 2023-04-26: qty 1

## 2023-04-26 MED ORDER — NAPROXEN 500 MG PO TABS: 500.0000 mg | ORAL_TABLET | Freq: Two times a day (BID) | ORAL | 0 refills | Status: AC

## 2023-04-26 NOTE — Discharge Instructions (Addendum)
It was a pleasure caring for you today. Xray negative for fractures. Seek emergency care if experiencing any new or worsening symptoms.  Alternating between 650 mg Tylenol and 400 mg Advil: The best way to alternate taking Acetaminophen (example Tylenol) and Ibuprofen (example Advil/Motrin) is to take them 3 hours apart. For example, if you take ibuprofen at 6 am you can then take Tylenol at 9 am. You can continue this regimen throughout the day, making sure you do not exceed the recommended maximum dose for each drug.

## 2023-04-26 NOTE — ED Provider Notes (Signed)
[place] EMERGENCY DEPARTMENT AT [facility] Provider Note   CSN: [id number] Arrival date & time: 04/26/23  2117     History  Chief Complaint  Patient presents with   Ankle Pain    Jonathan Stokes is a 37 y.o. male who presents to ED concerned for left ankle pain. Patient sprained his ankle 4 days ago and it was resolving until he twisted his ankle again while walking in kitchen this afternoon.  Denies fever, chest pain, dyspnea, cough, nausea, vomiting.   Ankle Pain      Home Medications Prior to Admission medications   Medication Sig Start Date End Date Taking? Authorizing Provider  hydrochlorothiazide (HYDRODIURIL) 25 MG tablet Take 1 tablet (25 mg total) by mouth daily. 01/20/22   Redwine, Madison A, PA-C  methocarbamol (ROBAXIN) 500 MG tablet Take 1 tablet (500 mg total) by mouth 2 (two) times daily. 08/13/21   Muthersbaugh, Dahlia Client, PA-C  naproxen (NAPROSYN) 500 MG tablet Take 1 tablet (500 mg total) by mouth 2 (two) times daily with a meal. 08/13/21   Muthersbaugh, Dahlia Client, PA-C  oxyCODONE (ROXICODONE) 5 MG immediate release tablet Take 1 tablet (5 mg total) by mouth every 6 (six) hours as needed for severe pain. 08/13/21   Muthersbaugh, Dahlia Client, PA-C      Allergies    Fish allergy    Review of Systems   Review of Systems  Musculoskeletal:        Ankle pain    Physical Exam Updated Vital Signs BP (!) 173/105 (BP Location: Left Arm)   Pulse 81   Temp 98.3 F (36.8 C) (Oral)   Resp 17   Ht 6\' 2"  (1.88 m)   Wt 131.5 kg   SpO2 98%   BMI 37.23 kg/m  Physical Exam Vitals and nursing note reviewed.  Constitutional:      General: He is not in acute distress.    Appearance: He is not ill-appearing or toxic-appearing.  HENT:     Head: Normocephalic and atraumatic.  Eyes:     General: No scleral icterus.       Right eye: No discharge.        Left eye: No discharge.     Conjunctiva/sclera: Conjunctivae normal.  Cardiovascular:     Rate and Rhythm:  Normal rate.  Pulmonary:     Effort: Pulmonary effort is normal.  Abdominal:     General: Abdomen is flat.  Musculoskeletal:     Comments: Left ankle active ROM restricted d/t pain. Mild-mod swelling of lateral malleolus. +2 pedal pulse and brisk capillary refill. Sensation to light touch intact.   Skin:    General: Skin is warm and dry.     Capillary Refill: Capillary refill takes less than 2 seconds.  Neurological:     General: No focal deficit present.     Mental Status: He is alert. Mental status is at baseline.  Psychiatric:        Mood and Affect: Mood normal.        Behavior: Behavior normal.     ED Results / Procedures / Treatments   Labs (all labs ordered are listed, but only abnormal results are displayed) Labs Reviewed - No data to display  EKG None  Radiology DG Ankle Complete Left  Result Date: 04/26/2023 CLINICAL DATA:  Rolled ankle, left ankle pain. EXAM: LEFT ANKLE COMPLETE - 3+ VIEW COMPARISON:  None Available. FINDINGS: There is no evidence of acute fracture or dislocation at the ankle. Degenerative changes are  noted at the ankle and midfoot. Soft tissue swelling is present about the ankle and most pronounced over the lateral malleolus. IMPRESSION: No acute fracture or dislocation. Electronically Signed   By: Thornell Sartorius M.D.   On: 04/26/2023 22:13    Procedures Procedures    Medications Ordered in ED Medications  naproxen (NAPROSYN) tablet 500 mg (has no administration in time range)    ED Course/ Medical Decision Making/ A&P                                 Medical Decision Making Amount and/or Complexity of Data Reviewed Radiology: ordered.   This patient presents to the ED for concern of left ankle pain, this involves an extensive number of treatment options, and is a complaint that carries with it a high risk of complications and morbidity.  The differential diagnosis includes hemarthrosis, gout, septic joint, fracture, tendonitis, carpal  tunnel syndrome, muscle strain, bursitis, compartment syndrome   Co morbidities that complicate the patient evaluation  none   Imaging Studies ordered:  I ordered imaging studies including ankle Xray to assess for fracture/dislocation I independently visualized and interpreted imaging Shared findings with patient I agree with the radiologist interpretation   Problem List / ED Course / Critical interventions / Medication management  Patient presents emergency room concerned for left ankle pain after twisting his ankle at home earlier today.  Physical exam with +2 pedal pulses, brisk capillary refill, and sensation to light touch intact of left foot.  Mild to moderate swelling of lateral malleolus.  Active range of motion restricted due to pain.  X-ray without concern for fractures or dislocations. Provided patient with naproxen for pain management in the emergency room.  Also provided ankle brace.  Provided patient with physical therapy for ankle sprain.  Recommended following up with primary care provider which patient verbally endorsed understanding of. I have reviewed the patients home medicines and have made adjustments as needed Patient afebrile with stable vitals.  Provided with return precautions.  Discharged in good condition.   Ddx these are considered less likely due to history of present illness and physical exam -hemarthrosis: not on blood thinners, no high impact trauma -gout: no warmth or erythema; pain was acute and associated with trauma. -septic joint: afebrile; no warmth or erythema; no skin changes -fracture: xray without concern  -compartment syndrome: area not tense; neurovascularly intact   Social Determinants of Health:  none         Final Clinical Impression(s) / ED Diagnoses Final diagnoses:  Sprain of left ankle, unspecified ligament, initial encounter    Rx / DC Orders ED Discharge Orders     None         Dorthy Cooler,  [place] 04/26/23 2240    Charlynne Pander, MD 04/26/23 [phone]

## 2023-04-26 NOTE — ED Triage Notes (Signed)
Pt c/o pain in left ankle, states he rolled his ankle twice in the past 2 days.

## 2023-09-08 IMAGING — CT CT HEAD W/O CM
3 series · 16 of 47 positions shown, 19 images · non-contrast
Comparison: None.

CLINICAL DATA: Trauma



[Series 2: head wo · axial · 0.44mm/px · z∈[-142,-2]mm · 10 of 34 slices shown, 13 images]
[im 3/34  brain]
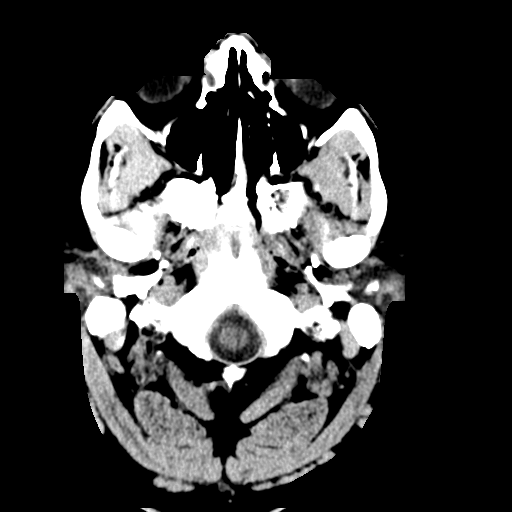
[im 3/34  bone]
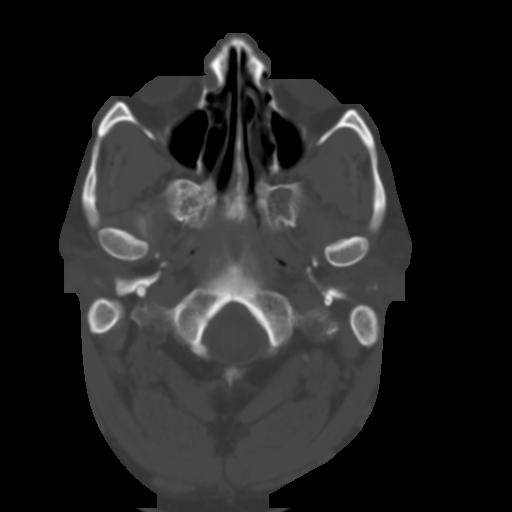
[im 6/34  brain]
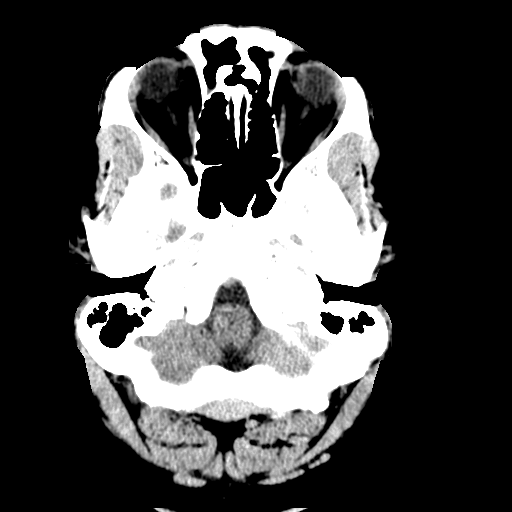
[im 10/34  brain]
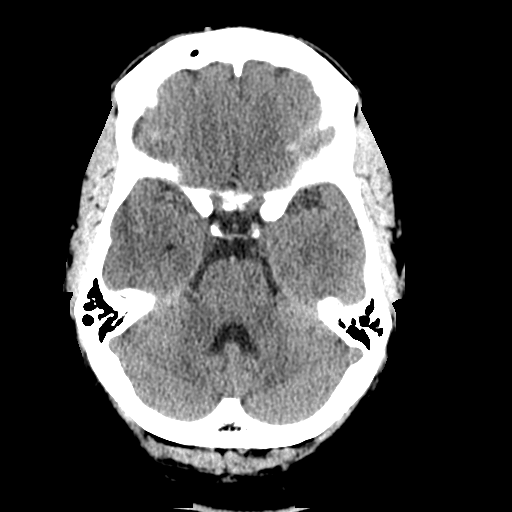
[im 12/34  brain]
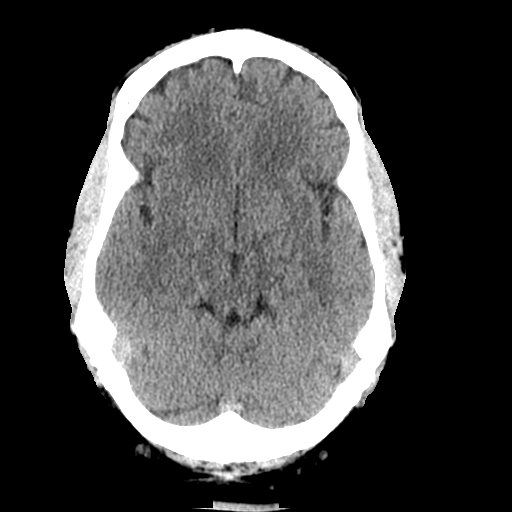
[im 15/34  brain]
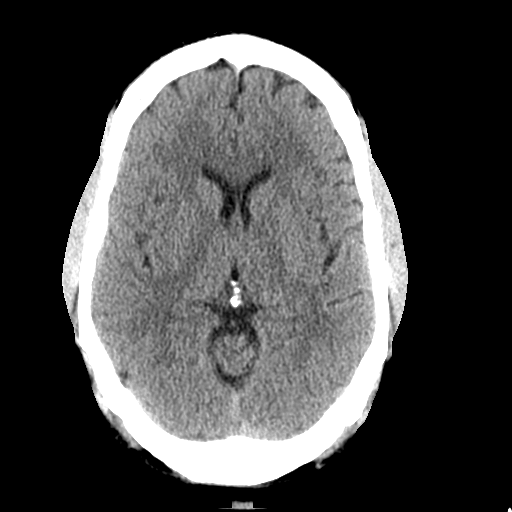
[im 15/34  bone]
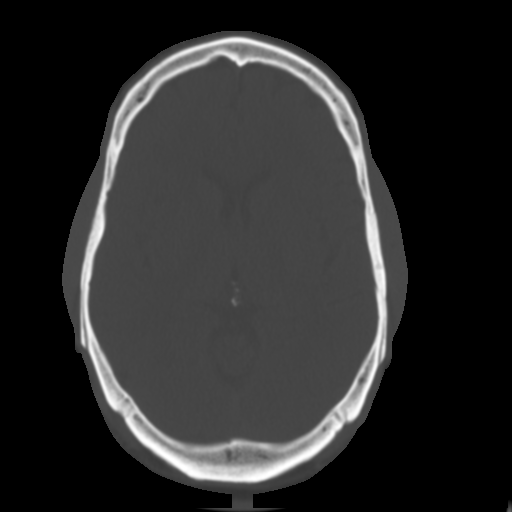
[im 19/34  brain]
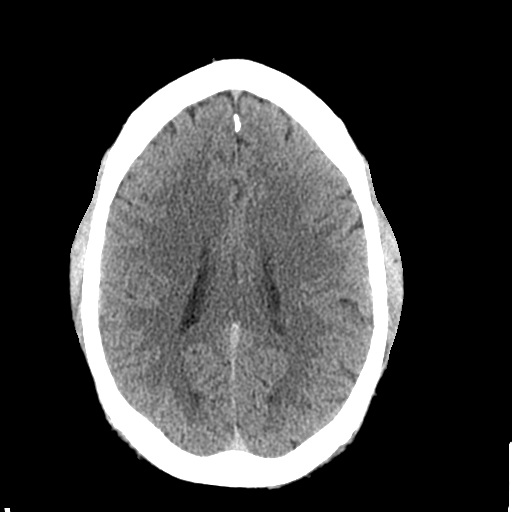
[im 22/34  brain]
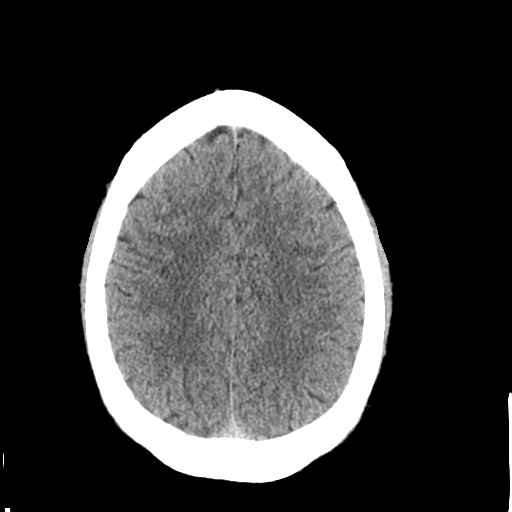
[im 26/34  brain]
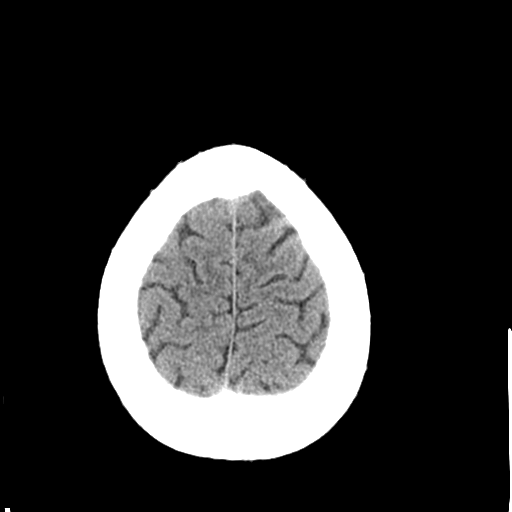
[im 28/34  brain]
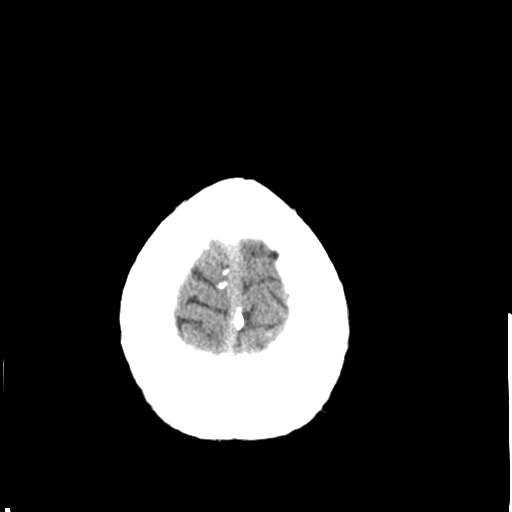
[im 28/34  bone]
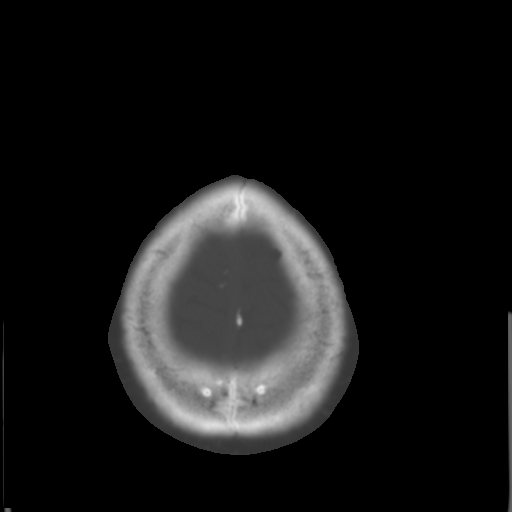
[im 31/34  brain]
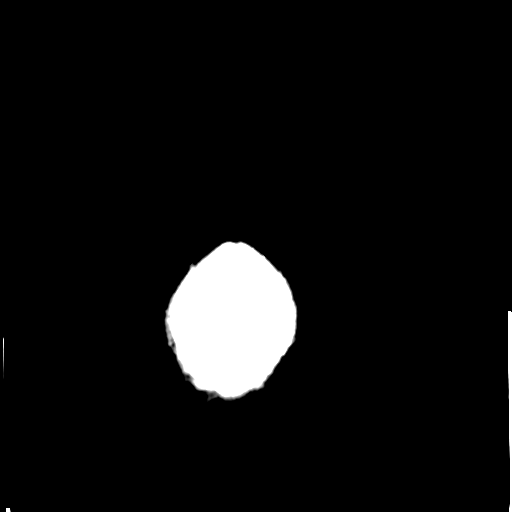

[Series 5: coronal soft tissue · coronal · 0.33mm/px · 3 of 71 slices shown]
[im 24/71  brain]
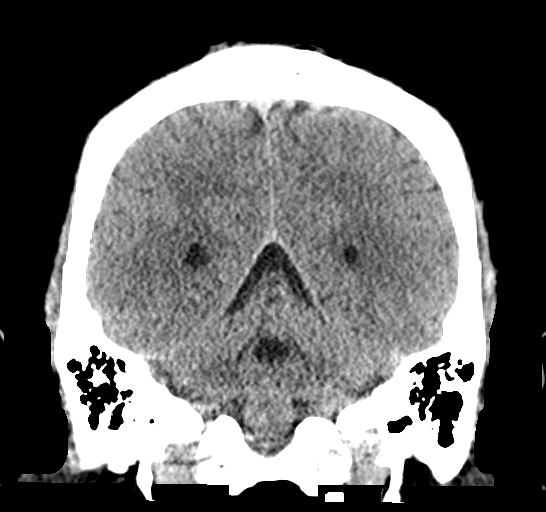
[im 32/71  brain]
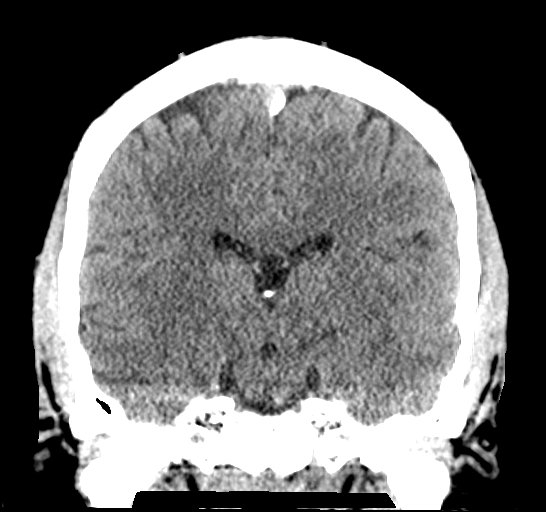
[im 39/71  brain]
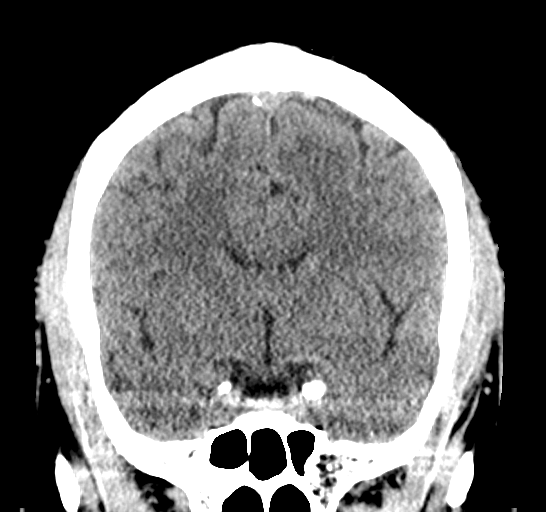

[Series 6: sagittal soft tissue · sagittal · 0.33mm/px · 3 of 60 slices shown]
[im 20/60  brain]
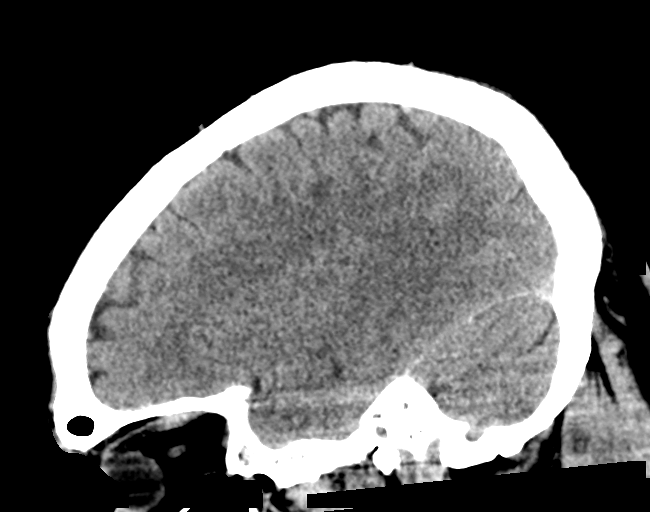
[im 30/60  brain]
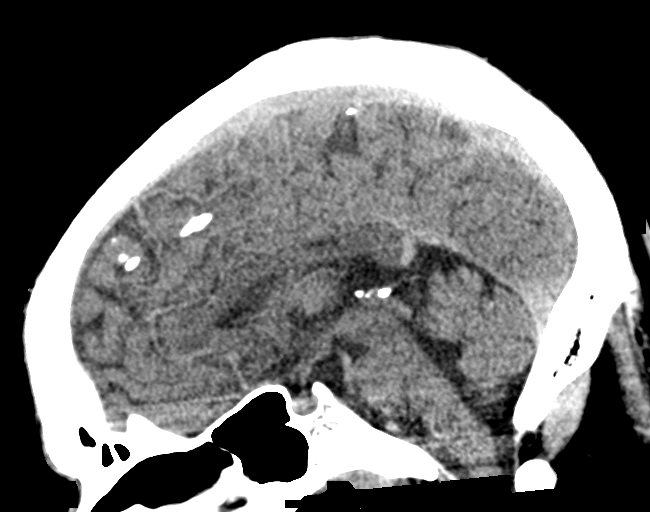
[im 40/60  brain]
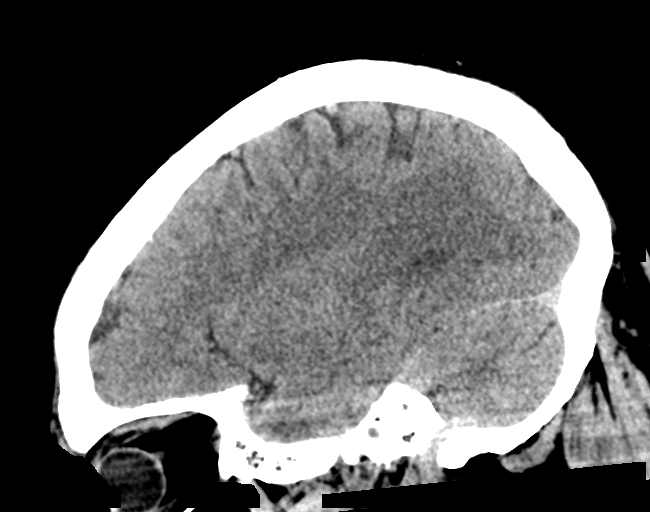

[16 of 47 positions shown; findings below may reference images not displayed]

FINDINGS: Brain: No acute intracranial findings are seen. Ventricles are not
dilated. There is no shift of midline structures. There are no
epidural or subdural fluid collections.

Vascular: Unremarkable.

Skull: Unremarkable.

Sinuses/Orbits: Unremarkable.

Other: None
IMPRESSION: No acute intracranial findings are seen in noncontrast CT brain.
